# Patient Record
Sex: Male | Born: 1965 | Race: White | Hispanic: No | Marital: Single | Smoking: Current every day smoker
Health system: Southern US, Community
[De-identification: ages and names within clinical notes are randomized; demographics above are authoritative.]

---

## 2011-11-22 ENCOUNTER — Emergency Department: Payer: Self-pay | Admitting: Unknown Physician Specialty

## 2013-06-15 ENCOUNTER — Ambulatory Visit: Admit: 2013-06-15 | Disposition: A | Discharge status: Home / Self Care | Payer: Self-pay | Admitting: Family Medicine

## 2020-10-08 DIAGNOSIS — M7989 Other specified soft tissue disorders: Secondary | ICD-10-CM | POA: Diagnosis present

## 2020-10-21 DIAGNOSIS — F1410 Cocaine abuse, uncomplicated: Secondary | ICD-10-CM | POA: Diagnosis present

## 2020-10-21 DIAGNOSIS — F329 Major depressive disorder, single episode, unspecified: Secondary | ICD-10-CM | POA: Diagnosis present

## 2020-10-21 DIAGNOSIS — F32A Depression, unspecified: Secondary | ICD-10-CM | POA: Diagnosis present

## 2020-11-08 DIAGNOSIS — F17200 Nicotine dependence, unspecified, uncomplicated: Secondary | ICD-10-CM | POA: Diagnosis present

## 2020-12-09 DIAGNOSIS — E109 Type 1 diabetes mellitus without complications: Secondary | ICD-10-CM | POA: Diagnosis present

## 2021-02-05 ENCOUNTER — Other Ambulatory Visit: Payer: Self-pay

## 2021-02-05 ENCOUNTER — Emergency Department: Payer: PRIVATE HEALTH INSURANCE

## 2021-02-05 ENCOUNTER — Encounter: Payer: Self-pay | Admitting: Radiology

## 2021-02-05 ENCOUNTER — Inpatient Hospital Stay
Admission: EM | Admit: 2021-02-05 | Discharge: 2021-02-09 | DRG: 565 | Disposition: A | Discharge status: Home Health | Payer: PRIVATE HEALTH INSURANCE | Attending: Student | Admitting: Student

## 2021-02-05 DIAGNOSIS — F329 Major depressive disorder, single episode, unspecified: Secondary | ICD-10-CM | POA: Diagnosis present

## 2021-02-05 DIAGNOSIS — L89892 Pressure ulcer of other site, stage 2: Secondary | ICD-10-CM | POA: Diagnosis present

## 2021-02-05 DIAGNOSIS — F1423 Cocaine dependence with withdrawal: Secondary | ICD-10-CM | POA: Diagnosis present

## 2021-02-05 DIAGNOSIS — Z9114 Patient's other noncompliance with medication regimen: Secondary | ICD-10-CM

## 2021-02-05 DIAGNOSIS — L03115 Cellulitis of right lower limb: Secondary | ICD-10-CM | POA: Diagnosis present

## 2021-02-05 DIAGNOSIS — E785 Hyperlipidemia, unspecified: Secondary | ICD-10-CM | POA: Diagnosis present

## 2021-02-05 DIAGNOSIS — F1210 Cannabis abuse, uncomplicated: Secondary | ICD-10-CM | POA: Diagnosis present

## 2021-02-05 DIAGNOSIS — L8990 Pressure ulcer of unspecified site, unspecified stage: Secondary | ICD-10-CM | POA: Insufficient documentation

## 2021-02-05 DIAGNOSIS — Z720 Tobacco use: Secondary | ICD-10-CM | POA: Diagnosis not present

## 2021-02-05 DIAGNOSIS — T8743 Infection of amputation stump, right lower extremity: Secondary | ICD-10-CM | POA: Diagnosis present

## 2021-02-05 DIAGNOSIS — Z79899 Other long term (current) drug therapy: Secondary | ICD-10-CM

## 2021-02-05 DIAGNOSIS — W19XXXA Unspecified fall, initial encounter: Secondary | ICD-10-CM | POA: Diagnosis present

## 2021-02-05 DIAGNOSIS — E109 Type 1 diabetes mellitus without complications: Secondary | ICD-10-CM | POA: Diagnosis present

## 2021-02-05 DIAGNOSIS — Y835 Amputation of limb(s) as the cause of abnormal reaction of the patient, or of later complication, without mention of misadventure at the time of the procedure: Secondary | ICD-10-CM | POA: Diagnosis present

## 2021-02-05 DIAGNOSIS — F32A Depression, unspecified: Secondary | ICD-10-CM | POA: Diagnosis present

## 2021-02-05 DIAGNOSIS — Z20822 Contact with and (suspected) exposure to covid-19: Secondary | ICD-10-CM | POA: Diagnosis present

## 2021-02-05 DIAGNOSIS — Z760 Encounter for issue of repeat prescription: Secondary | ICD-10-CM

## 2021-02-05 DIAGNOSIS — Z794 Long term (current) use of insulin: Secondary | ICD-10-CM | POA: Diagnosis not present

## 2021-02-05 DIAGNOSIS — F1493 Cocaine use, unspecified with withdrawal: Secondary | ICD-10-CM

## 2021-02-05 DIAGNOSIS — F1410 Cocaine abuse, uncomplicated: Secondary | ICD-10-CM | POA: Diagnosis present

## 2021-02-05 DIAGNOSIS — F419 Anxiety disorder, unspecified: Secondary | ICD-10-CM | POA: Diagnosis present

## 2021-02-05 DIAGNOSIS — R45851 Suicidal ideations: Secondary | ICD-10-CM | POA: Diagnosis present

## 2021-02-05 DIAGNOSIS — E1040 Type 1 diabetes mellitus with diabetic neuropathy, unspecified: Secondary | ICD-10-CM | POA: Diagnosis present

## 2021-02-05 DIAGNOSIS — M7989 Other specified soft tissue disorders: Secondary | ICD-10-CM | POA: Diagnosis present

## 2021-02-05 DIAGNOSIS — Z89611 Acquired absence of right leg above knee: Secondary | ICD-10-CM

## 2021-02-05 DIAGNOSIS — L089 Local infection of the skin and subcutaneous tissue, unspecified: Secondary | ICD-10-CM

## 2021-02-05 DIAGNOSIS — F17200 Nicotine dependence, unspecified, uncomplicated: Secondary | ICD-10-CM | POA: Diagnosis present

## 2021-02-05 DIAGNOSIS — Z9119 Patient's noncompliance with other medical treatment and regimen: Secondary | ICD-10-CM

## 2021-02-05 DIAGNOSIS — T148XXA Other injury of unspecified body region, initial encounter: Secondary | ICD-10-CM

## 2021-02-05 LAB — CBC
HCT: 39.3 % (ref 39.0–52.0)
Hemoglobin: 13.6 g/dL (ref 13.0–17.0)
MCH: 30.4 pg (ref 26.0–34.0)
MCHC: 34.6 g/dL (ref 30.0–36.0)
MCV: 87.7 fL (ref 80.0–100.0)
Platelets: 241 10*3/uL (ref 150–400)
RBC: 4.48 MIL/uL (ref 4.22–5.81)
RDW: 12.8 % (ref 11.5–15.5)
WBC: 12.5 10*3/uL — ABNORMAL HIGH (ref 4.0–10.5)
nRBC: 0 % (ref 0.0–0.2)

## 2021-02-05 LAB — URINE DRUG SCREEN, QUALITATIVE (ARMC ONLY)
Amphetamines, Ur Screen: NOT DETECTED
Barbiturates, Ur Screen: NOT DETECTED
Benzodiazepine, Ur Scrn: NOT DETECTED
Cannabinoid 50 Ng, Ur ~~LOC~~: POSITIVE — AB
Cocaine Metabolite,Ur ~~LOC~~: POSITIVE — AB
MDMA (Ecstasy)Ur Screen: NOT DETECTED
Methadone Scn, Ur: NOT DETECTED
Opiate, Ur Screen: NOT DETECTED
Phencyclidine (PCP) Ur S: NOT DETECTED
Tricyclic, Ur Screen: NOT DETECTED

## 2021-02-05 LAB — RESP PANEL BY RT-PCR (FLU A&B, COVID) ARPGX2
Influenza A by PCR: NEGATIVE
Influenza B by PCR: NEGATIVE
SARS Coronavirus 2 by RT PCR: NEGATIVE

## 2021-02-05 LAB — BASIC METABOLIC PANEL
Anion gap: 12 (ref 5–15)
BUN: 20 mg/dL (ref 6–20)
CO2: 21 mmol/L — ABNORMAL LOW (ref 22–32)
Calcium: 9.2 mg/dL (ref 8.9–10.3)
Chloride: 101 mmol/L (ref 98–111)
Creatinine, Ser: 0.76 mg/dL (ref 0.61–1.24)
GFR, Estimated: >60 mL/min (ref >60)
Glucose, Bld: 104 mg/dL — ABNORMAL HIGH (ref 70–99)
Potassium: 3.7 mmol/L (ref 3.5–5.1)
Sodium: 134 mmol/L — ABNORMAL LOW (ref 135–145)

## 2021-02-05 LAB — ACETAMINOPHEN LEVEL: Acetaminophen (Tylenol), Serum: <10 ug/mL — ABNORMAL LOW (ref 10–30)

## 2021-02-05 LAB — CBG MONITORING, ED: Glucose-Capillary: 271 mg/dL — ABNORMAL HIGH (ref 70–99)

## 2021-02-05 LAB — SALICYLATE LEVEL: Salicylate Lvl: <7 mg/dL — ABNORMAL LOW (ref 7.0–30.0)

## 2021-02-05 LAB — ETHANOL: Alcohol, Ethyl (B): <10 mg/dL (ref <10)

## 2021-02-05 MED ORDER — GABAPENTIN 400 MG PO CAPS
400.0000 mg | ORAL_CAPSULE | Freq: Three times a day (TID) | ORAL | Status: DC
  Administered 2021-02-06 – 2021-02-09 (×11): 400 mg via ORAL
  Filled 2021-02-05 (×12): qty 1

## 2021-02-05 MED ORDER — INSULIN DETEMIR 100 UNIT/ML ~~LOC~~ SOLN
14.0000 [IU] | Freq: Two times a day (BID) | SUBCUTANEOUS | Status: DC
  Filled 2021-02-05 (×2): qty 0.14

## 2021-02-05 MED ORDER — VANCOMYCIN HCL 1500 MG/300ML IV SOLN
1500.0000 mg | Freq: Once | INTRAVENOUS | Status: AC
  Administered 2021-02-05: 1500 mg via INTRAVENOUS
  Filled 2021-02-05: qty 300

## 2021-02-05 MED ORDER — MORPHINE SULFATE (PF) 2 MG/ML IV SOLN
2.0000 mg | INTRAVENOUS | Status: DC | PRN
  Administered 2021-02-05 – 2021-02-07 (×3): 2 mg via INTRAVENOUS
  Filled 2021-02-05 (×3): qty 1

## 2021-02-05 MED ORDER — INSULIN DETEMIR 100 UNIT/ML ~~LOC~~ SOLN
14.0000 [IU] | Freq: Two times a day (BID) | SUBCUTANEOUS | Status: DC
  Filled 2021-02-05: qty 0.14

## 2021-02-05 MED ORDER — ONDANSETRON HCL 4 MG PO TABS: 4.0000 mg | ORAL_TABLET | Freq: Four times a day (QID) | ORAL | Status: DC | PRN

## 2021-02-05 MED ORDER — SODIUM CHLORIDE 0.9 % IV SOLN: INTRAVENOUS | Status: DC

## 2021-02-05 MED ORDER — DOXYCYCLINE HYCLATE 100 MG PO CAPS
100.0000 mg | ORAL_CAPSULE | Freq: Two times a day (BID) | ORAL | 0 refills | Status: DC
  Filled 2021-02-05: qty 10, 5d supply, fill #0

## 2021-02-05 MED ORDER — LISINOPRIL 2.5 MG PO TABS
2.5000 mg | ORAL_TABLET | Freq: Every day | ORAL | Status: DC
  Administered 2021-02-06 – 2021-02-09 (×4): 2.5 mg via ORAL
  Filled 2021-02-05 (×4): qty 1

## 2021-02-05 MED ORDER — ATORVASTATIN CALCIUM 20 MG PO TABS
20.0000 mg | ORAL_TABLET | Freq: Every day | ORAL | Status: DC
  Administered 2021-02-06 – 2021-02-09 (×4): 20 mg via ORAL
  Filled 2021-02-05 (×4): qty 1

## 2021-02-05 MED ORDER — INSULIN ASPART 100 UNIT/ML IJ SOLN
0.0000 [IU] | Freq: Every day | INTRAMUSCULAR | Status: DC
  Administered 2021-02-05: 3 [IU] via SUBCUTANEOUS
  Administered 2021-02-06: 4 [IU] via SUBCUTANEOUS
  Filled 2021-02-05 (×2): qty 1

## 2021-02-05 MED ORDER — SODIUM CHLORIDE 0.9 % IV SOLN: 1.0000 g | Freq: Once | INTRAVENOUS | Status: DC

## 2021-02-05 MED ORDER — INSULIN DETEMIR 100 UNIT/ML ~~LOC~~ SOLN
18.0000 [IU] | Freq: Two times a day (BID) | SUBCUTANEOUS | Status: DC
  Administered 2021-02-06 – 2021-02-09 (×7): 18 [IU] via SUBCUTANEOUS
  Filled 2021-02-05 (×9): qty 0.18

## 2021-02-05 MED ORDER — VANCOMYCIN HCL IN DEXTROSE 1-5 GM/200ML-% IV SOLN
1000.0000 mg | Freq: Two times a day (BID) | INTRAVENOUS | Status: DC
  Administered 2021-02-06 – 2021-02-09 (×7): 1000 mg via INTRAVENOUS
  Filled 2021-02-05 (×11): qty 200

## 2021-02-05 MED ORDER — INSULIN ASPART 100 UNIT/ML IJ SOLN
0.0000 [IU] | Freq: Three times a day (TID) | INTRAMUSCULAR | Status: DC
  Administered 2021-02-06 (×2): 4 [IU] via SUBCUTANEOUS
  Administered 2021-02-06: 11 [IU] via SUBCUTANEOUS
  Administered 2021-02-07: 3 [IU] via SUBCUTANEOUS
  Administered 2021-02-07: 7 [IU] via SUBCUTANEOUS
  Filled 2021-02-05 (×5): qty 1

## 2021-02-05 MED ORDER — PIPERACILLIN-TAZOBACTAM 3.375 G IVPB 30 MIN
3.3750 g | Freq: Once | INTRAVENOUS | Status: AC
  Administered 2021-02-05: 3.375 g via INTRAVENOUS
  Filled 2021-02-05: qty 50

## 2021-02-05 MED ORDER — ONDANSETRON HCL 4 MG/2ML IJ SOLN: 4.0000 mg | Freq: Four times a day (QID) | INTRAMUSCULAR | Status: DC | PRN

## 2021-02-05 MED ORDER — ENOXAPARIN SODIUM 40 MG/0.4ML IJ SOSY
40.0000 mg | PREFILLED_SYRINGE | INTRAMUSCULAR | Status: DC
  Administered 2021-02-05 – 2021-02-09 (×4): 40 mg via SUBCUTANEOUS
  Filled 2021-02-05 (×4): qty 0.4

## 2021-02-05 MED ORDER — SODIUM CHLORIDE 0.9 % IV SOLN
2.0000 g | Freq: Three times a day (TID) | INTRAVENOUS | Status: DC
  Administered 2021-02-06 – 2021-02-09 (×11): 2 g via INTRAVENOUS
  Filled 2021-02-05 (×14): qty 2

## 2021-02-05 NOTE — ED Provider Notes (Addendum)
Patient was reporting some neck pain and does report having a fall.  Will get CT head and CT cervical.  He states that he fell on his butt mostly but given his substances I think it be safest to get a CT image.  Patient is denying SI but his mom called stating that she was concerned that he was having SI.  Therefore psych consult was placed.  Patient is also interested in detox therefore TTS was placed.  Pt states he does not know how to get into his home. Unclear if pt took insulin today and sugar here is normal. D/w pharmacy and pts on NPH. Ordered to start tomorrow given low sugars today and unclear if already took it.   The patient has been placed in psychiatric observation due to the need to provide a safe environment for the patient while obtaining psychiatric consultation and evaluation, as well as ongoing medical and medication management to treat the patient's condition.  The patient has not been placed under full IVC at this time.  Pt now more awake and reporting R leg redness/drainage. Pt has history of nec fasc is this leg- does not look like nec fasc now. Pt well appearing. Will get xray to confirm no free air. Pt not septic but took wound vac off most likely leading to the cellulitis. Will d/w medicine for admission to help with wound care/ IV antibiotics.         Concha Se, MD 02/05/21 2204

## 2021-02-05 NOTE — ED Provider Notes (Signed)
[facility] Emergency Department Provider Note   ____________________________________________   Event Date/Time   First MD Initiated Contact with Patient 02/05/21 1532     (approximate)  I have reviewed the triage vital signs and the nursing notes.   HISTORY  Chief Complaint Drug Problem and Medication Refill    HPI Samuel Walton is a 55 y.o. male who presents requesting medication refills as well as wound care to his AKA on the right.  Patient states that he has had a problem with cocaine in the past and within the last 24 hours went on a "crack bender".  Patient states that he did have some mild trauma to this right AKA after a fall last night resulting in significant pain today as well as purulent drainage.  Patient is requesting a prescription for his currently prescribed subcu insulin as well as resources for cocaine rehabilitation. Patient currently denies any vision changes, tinnitus, difficulty speaking, facial droop, sore throat, chest pain, shortness of breath, abdominal pain, nausea/vomiting/diarrhea, dysuria, or weakness/numbness/paresthesias in any extremity          No past medical history on file.  There are no problems to display for this patient.    Prior to Admission medications   Medication Sig Start Date End Date Taking? Authorizing Provider  doxycycline (VIBRAMYCIN) 50 MG capsule Take 2 capsules (100 mg total) by mouth 2 (two) times daily for 5 days. 02/05/21 02/10/21 Yes Merwyn Katos, MD    Allergies Patient has no known allergies.  No family history on file.  Social History    Review of Systems Constitutional: No fever/chills Eyes: No visual changes. ENT: No sore throat. Cardiovascular: Denies chest pain. Respiratory: Denies shortness of breath. Gastrointestinal: No abdominal pain.  No nausea, no vomiting.  No diarrhea. Genitourinary: Negative for dysuria. Musculoskeletal: For acute right stump pain with  surrounding erythema Skin: Negative for rash. Neurological: Negative for headaches, weakness/numbness/paresthesias in any extremity Psychiatric: Negative for suicidal ideation/homicidal ideation   ____________________________________________   PHYSICAL EXAM:  VITAL SIGNS: ED Triage Vitals  Enc Vitals Group     BP 02/05/21 1458 139/90     Pulse Rate 02/05/21 1456 (!) 104     Resp 02/05/21 1456 18     Temp 02/05/21 1456 98.3 F (36.8 C)     Temp Source 02/05/21 1456 Oral     SpO2 02/05/21 1456 98 %     Weight 02/05/21 1457 140 lb (63.5 kg)     Height 02/05/21 1457 5\' 7"  (1.702 m)     Head Circumference --      Peak Flow --      Pain Score 02/05/21 1457 5     Pain Loc --      Pain Edu? --      Excl. in GC? --    Constitutional: Alert and oriented. Well appearing and in no acute distress. Eyes: Conjunctivae are normal. PERRL. Head: Atraumatic. Nose: No congestion/rhinnorhea. Mouth/Throat: Mucous membranes are moist. Neck: No stridor Cardiovascular: Grossly normal heart sounds.  Good peripheral circulation. Respiratory: Normal respiratory effort.  No retractions. Gastrointestinal: Soft and nontender. No distention. Musculoskeletal: Right AKA at the upper thigh Neurologic:  Normal speech and language. No gross focal neurologic deficits are appreciated. Skin:  Skin is warm and dry.  Ulceration to the middle aspect of the right AKA as well as some surrounding erythema and induration Psychiatric: Mood and affect are normal. Speech and behavior are normal.  ____________________________________________   LABS (all  labs ordered are listed, but only abnormal results are displayed)  Labs Reviewed  CBC - Abnormal; Notable for the following components:      Result Value   WBC 12.5 (*)    All other components within normal limits  BASIC METABOLIC PANEL - Abnormal; Notable for the following components:   Sodium 134 (*)    CO2 21 (*)    Glucose, Bld 104 (*)    All other  components within normal limits   PROCEDURES  Procedure(s) performed (including Critical Care):  Procedures   ____________________________________________   INITIAL IMPRESSION / ASSESSMENT AND PLAN / ED COURSE  As part of my medical decision making, I reviewed the following data within the electronic medical record, if available:  Nursing notes reviewed and incorporated, Labs reviewed, EKG interpreted, Old chart reviewed, Radiograph reviewed and Notes from prior ED visits reviewed and incorporated      Presentation most consistent with simple cellulitis. Given History, Exam, and Workup I have low suspicion for Necrotizing Fasciitis, Abscess, Osteomyelitis, DVT or other emergent problem as a cause for this presentation.  Rx: Doxycycline 100 mg twice daily x5 days  Disposition: Discharge. No evidence of serious bacterial illness. Nontoxic appearing, VSS. Low risk for treatment failure based on history. Strict return precautions discussed with patient with full understanding. Advised patient to follow up promptly with primary care provider within next 48 hours.      ____________________________________________   FINAL CLINICAL IMPRESSION(S) / ED DIAGNOSES  Final diagnoses:  Medication refill  Cocaine withdrawal (HCC)  Hx of AKA (above knee amputation), right (HCC)  Cellulitis of right lower extremity     ED Discharge Orders          Ordered    doxycycline (VIBRAMYCIN) 50 MG capsule  2 times daily        02/05/21 1847             Note:  This document was prepared using Dragon voice recognition software and may include unintentional dictation errors.    Merwyn Katos, MD 02/05/21 [phone]

## 2021-02-05 NOTE — H&P (Signed)
History and Physical   Samuel Walton GLO:[id number] DOB: April 27, 1966 DOA: 02/05/2021  Referring MD/NP/PA: Dr. Fuller Plan  PCP: Pcp, No   Outpatient Specialists: [facility]  Patient coming from: Home  Chief Complaint: Fall with wound drainage  HPI: Samuel Walton is a 55 y.o. 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  Patient has 1 day he says of his stump on the right side.  He apparently has a wound VAC but has been staying away from his home over in [place] with a girlfriend.  Apparently his Leviste duration was not ideal and hemoglobin able to use his wound VAC.  Has not been able to take care of himself.  There is report of a lot of drugs involved as patient has also past history of cocaine abuse and the environment was not conducive for his care.  He has therefore not getting wound care and other medications.  Came to the ER was copious discharge from the stump and cellulitis surrounding the area.  While in the ER patient also voiced suicidal ideation but no plans.  He appears to be frustrated about his living condition.  Psych consult was called for and patient now denies suicidal ideation.  He has also voiced desire to have detoxification from drugs including cocaine.  He apparently had necrotizing fasciitis in the leg which led to initial surgery.  He is therefore being admitted for IV antibiotics as well as other supportive care..  ED Course: Temperature is 98.3 blood pressure 134/70, pulse 104, respirate of 18 oxygen sat 98% room air.  White count 12.5 hemoglobin 13.6 and platelets 241.  Sodium 134 potassium 3.7 chloride 101 CO2 21 BUN 20 creatinine 1.76.  Glucose is 271.  Urine drug screen is positive for cocaine and cannabis.  CT head and cervical spine  all within normal.  X-ray of the right femur area shows status post right leg amputation.  No soft tissue gas.  Patient will therefore be admitted for wound care as well as IV antibiotics.  Review of Systems: As per HPI otherwise 10 point review of systems negative.    History reviewed. No pertinent past medical history.  History reviewed. No pertinent surgical history.   has no history on file for tobacco use, alcohol use, and drug use.  No Known Allergies  History reviewed. No pertinent family history.   Prior to Admission medications   Medication Sig Start Date End Date Taking? Authorizing Provider  atorvastatin (LIPITOR) 20 MG tablet Take 1 tablet by mouth daily. 01/10/21  Yes [provider]  doxycycline (VIBRAMYCIN) 50 MG capsule Take 2 capsules (100 mg total) by mouth 2 (two) times daily for 5 days. 02/05/21 02/10/21 Yes Merwyn Katos, MD  gabapentin (NEURONTIN) 400 MG capsule Take 400 mg by mouth 3 (three) times daily. 01/11/21 02/10/21 Yes [provider]  insulin NPH Human (NOVOLIN N) 100 UNIT/ML injection Inject 18 Units into the skin 2 (two) times daily.   Yes [provider]  lisinopril (ZESTRIL) 5 MG tablet Take 2.5 mg by mouth daily. 12/22/20  Yes [provider]    Physical Exam: Vitals:   02/05/21 1456 02/05/21 1457 02/05/21 1458 02/05/21 1757  BP:   139/90 135/70  Pulse: (!) 104   100  Resp: 18   17  Temp:  98.3 F (36.8 C)     TempSrc: Oral     SpO2: 98%   98%  Weight:  63.5 kg    Height:  5\' 7"  (1.702 m)        Constitutional: Acutely ill looking, weak, depressed, withdrawn Vitals:   02/05/21 1456 02/05/21 1457 02/05/21 1458 02/05/21 1757  BP:   139/90 135/70  Pulse: (!) 104   100  Resp: 18   17  Temp: 98.3 F (36.8 C)     TempSrc: Oral     SpO2: 98%   98%  Weight:  63.5 kg    Height:  5\' 7"  (1.702 m)     Eyes: PERRL, lids and conjunctivae normal ENMT: Mucous membranes are dry. Posterior pharynx clear of any exudate  or lesions.Normal dentition.  Neck: normal, supple, no masses, no thyromegaly Respiratory: clear to auscultation bilaterally, no wheezing, no crackles. Normal respiratory effort. No accessory muscle use.  Cardiovascular: Sinus tachycardia, no murmurs / rubs / gallops. No extremity edema. 2+ pedal pulses. No carotid bruits.  Abdomen: no tenderness, no masses palpated. No hepatosplenomegaly. Bowel sounds positive.  Musculoskeletal: no clubbing / cyanosis.  Status post right leg amputation with visible stump nonhealed. Normal muscle tone.  Skin: Significant cellulitis around his right leg stump Neurologic: CN 2-12 grossly intact. Sensation intact, DTR normal. Strength 5/5 in all 4.  Psychiatric: Normal judgment and insight. Alert and oriented x 3. Normal mood.     Labs on Admission: I have personally reviewed following labs and imaging studies  CBC: Recent Labs  Lab 02/05/21 1502  WBC 12.5*  HGB 13.6  HCT 39.3  MCV 87.7  PLT 241   Basic Metabolic Panel: Recent Labs  Lab 02/05/21 1502  NA 134*  K 3.7  CL 101  CO2 21*  GLUCOSE 104*  BUN 20  CREATININE 0.76  CALCIUM 9.2   GFR: Estimated Creatinine Clearance: 93.7 mL/min (by C-G formula based on SCr of 0.76 mg/dL). Liver Function Tests: No results for input(s): AST, ALT, ALKPHOS, BILITOT, PROT, ALBUMIN in the last 168 hours. No results for input(s): LIPASE, AMYLASE in the last 168 hours. No results for input(s): AMMONIA in the last 168 hours. Coagulation Profile: No results for input(s): INR, PROTIME in the last 168 hours. Cardiac Enzymes: No results for input(s): CKTOTAL, CKMB, CKMBINDEX, TROPONINI in the last 168 hours. BNP (last 3 results) No results for input(s): PROBNP in the last 8760 hours. HbA1C: No results for input(s): HGBA1C in the last 72 hours. CBG: Recent Labs  Lab 02/05/21 2343  GLUCAP 271*   Lipid Profile: No results for input(s): CHOL, HDL, LDLCALC, TRIG, CHOLHDL, LDLDIRECT in the last 72  hours. Thyroid Function Tests: No results for input(s): TSH, T4TOTAL, FREET4, T3FREE, THYROIDAB in the last 72 hours. Anemia Panel: No results for input(s): VITAMINB12, FOLATE, FERRITIN, TIBC, IRON, RETICCTPCT in the last 72 hours. Urine analysis: No results found for: COLORURINE, APPEARANCEUR, LABSPEC, PHURINE, GLUCOSEU, HGBUR, BILIRUBINUR, KETONESUR, PROTEINUR, UROBILINOGEN, NITRITE, LEUKOCYTESUR Sepsis Labs: @LABRCNTIP (procalcitonin:4,lacticidven:4) ) Recent Results (from the past 240 hour(s))  Resp Panel by RT-PCR (Flu A&B, Covid) Nasopharyngeal Swab     Status: None   Collection Time: 02/05/21  8:29 PM   Specimen: Nasopharyngeal Swab; Nasopharyngeal(NP) swabs in vial transport medium  Result Value Ref Range Status   SARS Coronavirus 2 by RT PCR NEGATIVE NEGATIVE Final    Comment: (NOTE) SARS-CoV-2 target nucleic acids are NOT DETECTED.  The SARS-CoV-2 RNA is generally detectable in upper respiratory specimens during the  acute phase of infection. The lowest concentration of SARS-CoV-2 viral copies this assay can detect is 138 copies/mL. A negative result does not preclude SARS-Cov-2 infection and should not be used as the sole basis for treatment or other patient management decisions. A negative result may occur with  improper specimen collection/handling, submission of specimen other than nasopharyngeal swab, presence of viral mutation(s) within the areas targeted by this assay, and inadequate number of viral copies(<138 copies/mL). A negative result must be combined with clinical observations, patient history, and epidemiological information. The expected result is Negative.  Fact Sheet for Patients:  BloggerCourse.comhttps://www.fda.gov/media/152166/download  Fact Sheet for Healthcare Providers:  SeriousBroker.ithttps://www.fda.gov/media/152162/download  This test is no t yet approved or cleared by the [place] FDA and  has been authorized for detection and/or diagnosis of SARS-CoV-2 by FDA under  an Emergency Use Authorization (EUA). This EUA will remain  in effect (meaning this test can be used) for the duration of the COVID-19 declaration under Section 564(b)(1) of the Act, 21 U.S.C.section 360bbb-3(b)(1), unless the authorization is terminated  or revoked sooner.       Influenza A by PCR NEGATIVE NEGATIVE Final   Influenza B by PCR NEGATIVE NEGATIVE Final    Comment: (NOTE) The Xpert Xpress SARS-CoV-2/FLU/RSV plus assay is intended as an aid in the diagnosis of influenza from Nasopharyngeal swab specimens and should not be used as a sole basis for treatment. Nasal washings and aspirates are unacceptable for Xpert Xpress SARS-CoV-2/FLU/RSV testing.  Fact Sheet for Patients: BloggerCourse.comhttps://www.fda.gov/media/152166/download  Fact Sheet for Healthcare Providers: SeriousBroker.ithttps://www.fda.gov/media/152162/download  This test is not yet approved or cleared by the [place] FDA and has been authorized for detection and/or diagnosis of SARS-CoV-2 by FDA under an Emergency Use Authorization (EUA). This EUA will remain in effect (meaning this test can be used) for the duration of the COVID-19 declaration under Section 564(b)(1) of the Act, 21 U.S.C. section 360bbb-3(b)(1), unless the authorization is terminated or revoked.  Performed at [facility], [address] [place], [place] [address]      Radiological Exams on Admission: CT HEAD WO CONTRAST (5MM)  Result Date: 02/05/2021 CLINICAL DATA:  Status post trauma. EXAM: CT HEAD WITHOUT CONTRAST TECHNIQUE: Contiguous axial images were obtained from the base of the skull through the vertex without intravenous contrast. COMPARISON:  None. FINDINGS: Brain: No evidence of acute infarction, hemorrhage, hydrocephalus, extra-axial collection or mass lesion/mass effect. Vascular: No hyperdense vessel or unexpected calcification. Skull: Normal. Negative for fracture or focal lesion. Sinuses/Orbits: No acute finding. Other: Very mild left  parieto-occipital scalp soft tissue swelling is seen. IMPRESSION: No acute intracranial pathology. Electronically Signed   By: Aram Candelahaddeus  Houston M.D.   On: 02/05/2021 21:30   CT Cervical Spine Wo Contrast  Result Date: 02/05/2021 CLINICAL DATA:  Status post fall. EXAM: CT CERVICAL SPINE WITHOUT CONTRAST TECHNIQUE: Multidetector CT imaging of the cervical spine was performed without intravenous contrast. Multiplanar CT image reconstructions were also generated. COMPARISON:  None. FINDINGS: Alignment: Normal. Skull base and vertebrae: No acute fracture. No primary bone lesion or focal pathologic process. Soft tissues and spinal canal: No prevertebral fluid or swelling. No visible canal hematoma. Disc levels: Mild anterior osteophyte formation is seen at the levels of C4-C5 and C5-C6. Normal multilevel intervertebral disc spaces are present. Normal bilateral multilevel facet joints are noted. Upper chest: Negative. Other: None. IMPRESSION: 1. No acute fracture or subluxation of the cervical spine. 2. Mild degenerative changes at the levels of C4-C5 and C5-C6. Electronically Signed  By: Aram Candela M.D.   On: 02/05/2021 21:33   DG Femur Min 2 Views Right  Result Date: 02/05/2021 CLINICAL DATA:  Right leg/thigh amputation, cellulitis, evaluate for soft tissue gas EXAM: RIGHT FEMUR 2 VIEWS COMPARISON:  None. FINDINGS: Status post right leg amputation. Mild soft tissue swelling is possible in this patient with reported history of cellulitis. However, there is no radiographic findings to suggest soft tissue gas in this location. Mild degenerative changes of the lower lumbar spine. IMPRESSION: Status post right leg amputation. No radiographic findings to suggest soft tissue gas. Electronically Signed   By: Charline Bills M.D.   On: 02/05/2021 22:40      Assessment/Plan Principal Problem:   Infected wound Active Problems:   Cocaine use disorder (HCC)   Necrotizing soft tissue infection   Tobacco  use disorder   Type 1 diabetes (HCC)   Depressive disorder     #1 infected wound: Patient has infected stump.  Most likely due to not complying with medications and treatment.  At this point we will admit the patient.  IV vancomycin and cefepime.  Blood cultures and wound culture obtained.  Wound care consult.  #2 diabetes: Initiate sliding scale insulin.  Continue long-acting insulin from home  #3 cocaine abuse: Counseling provided.  Patient is interested in detox.  We will have social work consult.  #4 tobacco and marijuana abuse: Counseling.  Continue with plan for detoxification and outpatient support services  #5 depression with possible suicidal ideation: At this point patient denied suicidal ideation.  We will continue supportive care.  Transfer to voluntary hold in the ER for safety.   DVT prophylaxis: Lovenox Code Status: Full code Family Communication: Mother over the phone Disposition Plan: Home Consults called: None Admission status: Inpatient  Severity of Illness: The appropriate patient status for this patient is INPATIENT. Inpatient status is judged to be reasonable and necessary in order to provide the required intensity of service to ensure the patient's safety. The patient's presenting symptoms, physical exam findings, and initial radiographic and laboratory data in the context of their chronic comorbidities is felt to place them at high risk for further clinical deterioration. Furthermore, it is not anticipated that the patient will be medically stable for discharge from the hospital within 2 midnights of admission. The following factors support the patient status of inpatient.   " The patient's presenting symptoms include right stump infection. " The worrisome physical exam findings include drainage and cellulitis of the right stump. " The initial radiographic and laboratory data are worrisome because of evidence of cocaine and other drug abuse. " The chronic  co-morbidities include necrotizing fasciitis.   * I certify that at the point of admission it is my clinical judgment that the patient will require inpatient hospital care spanning beyond 2 midnights from the point of admission due to high intensity of service, high risk for further deterioration and high frequency of surveillance required.Lonia Blood MD [facility] Pager [phone]  If 7PM-7AM, please contact night-coverage [email or web address] Password [email or web address]  02/06/2021, 12:41 AM

## 2021-02-05 NOTE — ED Triage Notes (Signed)
Pt here for medication on his fast acting insulin and wants info on rehab for drugs. Pt admits to using crack and would like to stop. Pt has his right leg and thigh amputated and is carrying a large bag with his wound vac in it. Pt stable on arrival to ED.

## 2021-02-05 NOTE — ED Notes (Signed)
Pt has a right leg amputation , trying to locate wound vac supplies

## 2021-02-05 NOTE — BH Assessment (Signed)
This Clinical research associate provided patient with inpatient detox facilities and outpatient substance abuse facilities as resources for the patient at his request. Patient reports that he does not want to get help for his substance use and reports "I'm only doing it because of my mom, she wants me to stop." Patient also denies any current SI. Patient is receptive of the resources  TTS Consult Completed

## 2021-02-05 NOTE — Progress Notes (Signed)
Pharmacy Antibiotic Note  Samuel Walton is a 55 y.o. male admitted on 02/05/2021 with  wound infection .  Pharmacy has been consulted for Cefepime, Vancomycin  dosing.  Plan: Zosyn 3.375 gm IV X 1 over 30 min given on 8/29 @ 2233. Cefepime 2 gm IV Q8H ordered to start on 8/30 @ 0400.  Vancomycin 1500 mg IV X 1 given on 8/29 @ 2248. Vancomycin 1 gm IV Q12H ordered to start on 8/30 @ 1100.  AUC = 512.5 Vanc trough = 13.3   Height: 5\' 7"  (170.2 cm) Weight: 63.5 kg (140 lb) IBW/kg (Calculated) : 66.1  Temp (24hrs), Avg:98.3 F (36.8 C), Min:98.3 F (36.8 C), Max:98.3 F (36.8 C)  Recent Labs  Lab 02/05/21 1502  WBC 12.5*  CREATININE 0.76    Estimated Creatinine Clearance: 93.7 mL/min (by C-G formula based on SCr of 0.76 mg/dL).    No Known Allergies  Antimicrobials this admission:   >>    >>   Dose adjustments this admission:   Microbiology results:  BCx:   UCx:    Sputum:    MRSA PCR:   Thank you for allowing pharmacy to be a part of this patient's care.  Amrie Gurganus D 02/05/2021 11:15 PM

## 2021-02-05 NOTE — Progress Notes (Signed)
PHARMACY -  BRIEF ANTIBIOTIC NOTE   Pharmacy has received consult(s) for vancomycin from an ED provider.  The patient's profile has been reviewed for ht/wt/allergies/indication/available labs.    One time order(s) placed for vancomycin 1500 mg IV  Further antibiotics/pharmacy consults should be ordered by admitting physician if indicated.                       Thank you, Robyne Peers Chenoa Luddy 02/05/2021  10:11 PM

## 2021-02-05 NOTE — ED Notes (Signed)
Pt bedding changed. Urine soaked sheets. Pt changed into blue paper scrub pants and a burgundy psych scrub shirt. Brief also in place with chuck underneath pt.  Pt continued to complain of upper neck pain. Pt states that he fell onto concrete flooring and his neck is really bothering him. Pt said that he has not eaten today. Tech provided pt with drink and sandwich tray.  Pt said that he is continent with his urine out put and will provide a sample when he drinks a bit.

## 2021-02-06 ENCOUNTER — Other Ambulatory Visit: Payer: Self-pay

## 2021-02-06 ENCOUNTER — Encounter: Payer: Self-pay | Admitting: Internal Medicine

## 2021-02-06 LAB — COMPREHENSIVE METABOLIC PANEL
ALT: 20 U/L (ref 0–44)
AST: 21 U/L (ref 15–41)
Albumin: 3.5 g/dL (ref 3.5–5.0)
Alkaline Phosphatase: 64 U/L (ref 38–126)
Anion gap: 8 (ref 5–15)
BUN: 16 mg/dL (ref 6–20)
CO2: 24 mmol/L (ref 22–32)
Calcium: 8.7 mg/dL — ABNORMAL LOW (ref 8.9–10.3)
Chloride: 107 mmol/L (ref 98–111)
Creatinine, Ser: 0.75 mg/dL (ref 0.61–1.24)
GFR, Estimated: >60 mL/min (ref >60)
Glucose, Bld: 105 mg/dL — ABNORMAL HIGH (ref 70–99)
Potassium: 3.4 mmol/L — ABNORMAL LOW (ref 3.5–5.1)
Sodium: 139 mmol/L (ref 135–145)
Total Bilirubin: 0.9 mg/dL (ref 0.3–1.2)
Total Protein: 7.2 g/dL (ref 6.5–8.1)

## 2021-02-06 LAB — CBC
HCT: 41.1 % (ref 39.0–52.0)
Hemoglobin: 13.7 g/dL (ref 13.0–17.0)
MCH: 29.1 pg (ref 26.0–34.0)
MCHC: 33.3 g/dL (ref 30.0–36.0)
MCV: 87.4 fL (ref 80.0–100.0)
Platelets: 241 10*3/uL (ref 150–400)
RBC: 4.7 MIL/uL (ref 4.22–5.81)
RDW: 13 % (ref 11.5–15.5)
WBC: 6.2 10*3/uL (ref 4.0–10.5)
nRBC: 0 % (ref 0.0–0.2)

## 2021-02-06 LAB — GLUCOSE, CAPILLARY
Glucose-Capillary: 162 mg/dL — ABNORMAL HIGH (ref 70–99)
Glucose-Capillary: 283 mg/dL — ABNORMAL HIGH (ref 70–99)
Glucose-Capillary: 216 mg/dL — ABNORMAL HIGH (ref 70–99)
Glucose-Capillary: 308 mg/dL — ABNORMAL HIGH (ref 70–99)

## 2021-02-06 LAB — HIV ANTIBODY (ROUTINE TESTING W REFLEX): HIV Screen 4th Generation wRfx: NONREACTIVE

## 2021-02-06 MED ORDER — CHLORHEXIDINE GLUCONATE 0.12 % MT SOLN
15.0000 mL | Freq: Two times a day (BID) | OROMUCOSAL | Status: DC
  Administered 2021-02-06 – 2021-02-09 (×6): 15 mL via OROMUCOSAL
  Filled 2021-02-06 (×6): qty 15

## 2021-02-06 MED ORDER — ORAL CARE MOUTH RINSE
15.0000 mL | Freq: Two times a day (BID) | OROMUCOSAL | Status: DC
  Administered 2021-02-08 – 2021-02-09 (×3): 15 mL via OROMUCOSAL

## 2021-02-06 NOTE — Progress Notes (Signed)
PROGRESS NOTE    Samuel Walton  NTI:[id number] DOB: June 25, 1965 DOA: 02/05/2021 PCP: Pcp, No   Brief Narrative:  55 y.o. 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  Patient has 1 day he says of his stump on the right side.  He apparently has a wound VAC but has been staying away from his home over in [place] with a girlfriend.  Apparently his Leviste duration was not ideal and hemoglobin able to use his wound VAC.  Has not been able to take care of himself.  There is report of a lot of drugs involved as patient has also past history of cocaine abuse and the environment was not conducive for his care.  He has therefore not getting wound care and other medications.  Came to the ER was copious discharge from the stump and cellulitis surrounding the area.  While in the ER patient also voiced suicidal ideation but no plans.  He appears to be frustrated about his living condition.  Psych consult was called for and patient now denies suicidal ideation.  He has also voiced desire to have detoxification from drugs including cocaine.  He apparently had necrotizing fasciitis in the leg which led to initial surgery.  He is therefore being admitted for IV antibiotics as well as other supportive care..  Stump was evaluated.  Patient not septic or toxic.  Imaging negative for soft tissue gas.  WOCN consult.  Wound VAC to be replaced 8/30   Assessment & Plan:   Principal Problem:   Infected wound Active Problems:   Cocaine use disorder (HCC)   Necrotizing soft tissue infection   Tobacco use disorder   Type 1 diabetes (HCC)   Depressive disorder  Infected stump status post right AKA Patient with infected stump Secondary to nonadherence with medications and treatment Wound  VAC was removed at some point Purulent drainage noted from stump site Imaging negative for bone involvement or soft tissue gas Plan: W OC consult Replace wound VAC Continue broad-spectrum antibiotics for today As needed pain control, patient pain-free on my exam today Follow blood and wound cultures AKA was done at [facility].  Considering hemodynamic stability and no bone involvement or soft tissue gas seen on imaging will defer surgical consultation for now.  If patient clinically deteriorates consider consultation with vascular or general surgery.  Insulin-dependent diabetes mellitus Basal bolus regimen Sliding scale coverage Carb modified diet  Polysubstance abuse Tobacco abuse UDS positive for cocaine and cannabis Patient interested in quitting TOC consult for substance abuse resources  Depression with possible suicidal ideation On my interview patient denied suicidal intent or ideation Consider inpatient psychiatry consult   DVT prophylaxis: SQ Lovenox Code Status: Full Family Communication: None today Disposition Plan: Status is: Inpatient  Remains inpatient appropriate because:Inpatient level of care appropriate due to severity of illness  Dispo: The patient is from: Home              Anticipated d/c is to: Home              Patient currently is not medically stable to d/c.   Difficult to place patient No       Level of care: Med-Surg  Consultants:  None  Procedures:  None  Antimicrobials:  Vancomycin Cefepime   Subjective: Seen and examined.  Resting  comfortably in bed.  No visible distress.  Answers her questions appropriately.  Vehemently denies suicidal ideation or intent  Objective: Vitals:   02/06/21 0054 02/06/21 0150 02/06/21 0453 02/06/21 0852  BP: 137/76 127/75 117/77 117/78  Pulse: 89 88 95 88  Resp: 16 18 17 18   Temp: 97.9 F (36.6 C) 99.2 F (37.3 C) 99.1 F (37.3 C) 99.1 F (37.3 C)  TempSrc: Oral  Oral Oral  SpO2: 97% 96% 92% 96%   Weight:      Height:        Intake/Output Summary (Last 24 hours) at 02/06/2021 1035 Last data filed at 02/06/2021 0900 Gross per 24 hour  Intake 751.78 ml  Output 600 ml  Net 151.78 ml   Filed Weights   02/05/21 1457  Weight: 63.5 kg    Examination:  General exam: Appears calm and comfortable, appears chronically ill Respiratory system: Clear to auscultation. Respiratory effort normal. Cardiovascular system: S1-S2, regular rate and rhythm, no murmurs, no pedal edema Gastrointestinal system: Soft, nontender, nondistended, normal bowel sounds Central nervous system: Alert and oriented. No focal neurological deficits. Extremities: Status post right AKA Skin: No rashes, lesions or ulcers Psychiatry: Judgement and insight appear normal. Mood & affect appropriate.     Data Reviewed: I have personally reviewed following labs and imaging studies  CBC: Recent Labs  Lab 02/05/21 1502 02/06/21 0405  WBC 12.5* 6.2  HGB 13.6 13.7  HCT 39.3 41.1  MCV 87.7 87.4  PLT 241 241   Basic Metabolic Panel: Recent Labs  Lab 02/05/21 1502 02/06/21 0405  NA 134* 139  K 3.7 3.4*  CL 101 107  CO2 21* 24  GLUCOSE 104* 105*  BUN 20 16  CREATININE 0.76 0.75  CALCIUM 9.2 8.7*   GFR: Estimated Creatinine Clearance: 93.7 mL/min (by C-G formula based on SCr of 0.75 mg/dL). Liver Function Tests: Recent Labs  Lab 02/06/21 0405  AST 21  ALT 20  ALKPHOS 64  BILITOT 0.9  PROT 7.2  ALBUMIN 3.5   No results for input(s): LIPASE, AMYLASE in the last 168 hours. No results for input(s): AMMONIA in the last 168 hours. Coagulation Profile: No results for input(s): INR, PROTIME in the last 168 hours. Cardiac Enzymes: No results for input(s): CKTOTAL, CKMB, CKMBINDEX, TROPONINI in the last 168 hours. BNP (last 3 results) No results for input(s): PROBNP in the last 8760 hours. HbA1C: No results for input(s): HGBA1C in the last 72 hours. CBG: Recent Labs  Lab 02/05/21 2343  02/06/21 0859  GLUCAP 271* 162*   Lipid Profile: No results for input(s): CHOL, HDL, LDLCALC, TRIG, CHOLHDL, LDLDIRECT in the last 72 hours. Thyroid Function Tests: No results for input(s): TSH, T4TOTAL, FREET4, T3FREE, THYROIDAB in the last 72 hours. Anemia Panel: No results for input(s): VITAMINB12, FOLATE, FERRITIN, TIBC, IRON, RETICCTPCT in the last 72 hours. Sepsis Labs: No results for input(s): PROCALCITON, LATICACIDVEN in the last 168 hours.  Recent Results (from the past 240 hour(s))  Resp Panel by RT-PCR (Flu A&B, Covid) Nasopharyngeal Swab     Status: None   Collection Time: 02/05/21  8:29 PM   Specimen: Nasopharyngeal Swab; Nasopharyngeal(NP) swabs in vial transport medium  Result Value Ref Range Status   SARS Coronavirus 2 by RT PCR NEGATIVE NEGATIVE Final    Comment: (NOTE) SARS-CoV-2 target nucleic acids are NOT DETECTED.  The SARS-CoV-2 RNA is generally detectable in upper respiratory specimens during the acute phase of infection. The lowest concentration of SARS-CoV-2 viral copies this assay can  detect is 138 copies/mL. A negative result does not preclude SARS-Cov-2 infection and should not be used as the sole basis for treatment or other patient management decisions. A negative result may occur with  improper specimen collection/handling, submission of specimen other than nasopharyngeal swab, presence of viral mutation(s) within the areas targeted by this assay, and inadequate number of viral copies(<138 copies/mL). A negative result must be combined with clinical observations, patient history, and epidemiological information. The expected result is Negative.  Fact Sheet for Patients:  BloggerCourse.comhttps://www.fda.gov/media/152166/download  Fact Sheet for Healthcare Providers:  SeriousBroker.ithttps://www.fda.gov/media/152162/download  This test is no t yet approved or cleared by the [place] FDA and  has been authorized for detection and/or diagnosis of SARS-CoV-2 by FDA under an  Emergency Use Authorization (EUA). This EUA will remain  in effect (meaning this test can be used) for the duration of the COVID-19 declaration under Section 564(b)(1) of the Act, 21 U.S.C.section 360bbb-3(b)(1), unless the authorization is terminated  or revoked sooner.       Influenza A by PCR NEGATIVE NEGATIVE Final   Influenza B by PCR NEGATIVE NEGATIVE Final    Comment: (NOTE) The Xpert Xpress SARS-CoV-2/FLU/RSV plus assay is intended as an aid in the diagnosis of influenza from Nasopharyngeal swab specimens and should not be used as a sole basis for treatment. Nasal washings and aspirates are unacceptable for Xpert Xpress SARS-CoV-2/FLU/RSV testing.  Fact Sheet for Patients: BloggerCourse.comhttps://www.fda.gov/media/152166/download  Fact Sheet for Healthcare Providers: SeriousBroker.ithttps://www.fda.gov/media/152162/download  This test is not yet approved or cleared by the [place] FDA and has been authorized for detection and/or diagnosis of SARS-CoV-2 by FDA under an Emergency Use Authorization (EUA). This EUA will remain in effect (meaning this test can be used) for the duration of the COVID-19 declaration under Section 564(b)(1) of the Act, 21 U.S.C. section 360bbb-3(b)(1), unless the authorization is terminated or revoked.  Performed at [facility], [address] [place], [place] [address]          Radiology Studies: CT HEAD WO CONTRAST (5MM)  Result Date: 02/05/2021 CLINICAL DATA:  Status post trauma. EXAM: CT HEAD WITHOUT CONTRAST TECHNIQUE: Contiguous axial images were obtained from the base of the skull through the vertex without intravenous contrast. COMPARISON:  None. FINDINGS: Brain: No evidence of acute infarction, hemorrhage, hydrocephalus, extra-axial collection or mass lesion/mass effect. Vascular: No hyperdense vessel or unexpected calcification. Skull: Normal. Negative for fracture or focal lesion. Sinuses/Orbits: No acute finding. Other: Very mild left  parieto-occipital scalp soft tissue swelling is seen. IMPRESSION: No acute intracranial pathology. Electronically Signed   By: Aram Candelahaddeus  Houston M.D.   On: 02/05/2021 21:30   CT Cervical Spine Wo Contrast  Result Date: 02/05/2021 CLINICAL DATA:  Status post fall. EXAM: CT CERVICAL SPINE WITHOUT CONTRAST TECHNIQUE: Multidetector CT imaging of the cervical spine was performed without intravenous contrast. Multiplanar CT image reconstructions were also generated. COMPARISON:  None. FINDINGS: Alignment: Normal. Skull base and vertebrae: No acute fracture. No primary bone lesion or focal pathologic process. Soft tissues and spinal canal: No prevertebral fluid or swelling. No visible canal hematoma. Disc levels: Mild anterior osteophyte formation is seen at the levels of C4-C5 and C5-C6. Normal multilevel intervertebral disc spaces are present. Normal bilateral multilevel facet joints are noted. Upper chest: Negative. Other: None. IMPRESSION: 1. No acute fracture or subluxation of the cervical spine. 2. Mild degenerative changes at the levels of C4-C5 and C5-C6. Electronically Signed   By: Aram Candelahaddeus  Houston M.D.   On: 02/05/2021 21:33  DG Femur Min 2 Views Right  Result Date: 02/05/2021 CLINICAL DATA:  Right leg/thigh amputation, cellulitis, evaluate for soft tissue gas EXAM: RIGHT FEMUR 2 VIEWS COMPARISON:  None. FINDINGS: Status post right leg amputation. Mild soft tissue swelling is possible in this patient with reported history of cellulitis. However, there is no radiographic findings to suggest soft tissue gas in this location. Mild degenerative changes of the lower lumbar spine. IMPRESSION: Status post right leg amputation. No radiographic findings to suggest soft tissue gas. Electronically Signed   By: Charline Bills M.D.   On: 02/05/2021 22:40        Scheduled Meds:  atorvastatin  20 mg Oral Daily   enoxaparin (LOVENOX) injection  40 mg Subcutaneous Q24H   gabapentin  400 mg Oral TID    insulin aspart  0-20 Units Subcutaneous TID WC   insulin aspart  0-5 Units Subcutaneous QHS   insulin detemir  18 Units Subcutaneous BID   lisinopril  2.5 mg Oral Daily   Continuous Infusions:  sodium chloride 100 mL/hr at 02/06/21 0650   ceFEPime (MAXIPIME) IV Stopped (02/06/21 0459)   vancomycin       LOS: 1 day    Time spent: 35 minutes    Tresa Moore, MD [facility] Pager [phone]  If 7PM-7AM, please contact night-coverage 02/06/2021, 10:35 AM

## 2021-02-06 NOTE — Consult Note (Signed)
Pharmacy Antibiotic Note  Samuel Walton is a 55 y.o. 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  Pharmacy has been consulted for vancomycin and cefepime dosing.  Pt has been afebrile since admin with WBC trend: 12.5>6.2.   Cultures were not obtained, and pt received vanc and zosyn x1 in ED  Plan: -Vancomycin   -Given vancomycin 1500mg  IV x1 in ED   -Will continue 1000mg  IV q12h     -Expected AUC: 532.3     -Css min: 14.1     -Wt used for CrCl and Ke: TBW     -Vd: 0.72   -Will obtain labs around 4th or 5th dose if continued   -Cefepime   -Will continue 2g IV q8h based on renal function   -Will continue to monitor renal function and adjust dose as clinically indicated    Height: 5\' 7"  (170.2 cm) Weight: 63.5 kg (140 lb) IBW/kg (Calculated) : 66.1  Temp (24hrs), Avg:98.6 F (37 C), Min:97.9 F (36.6 C), Max:99.2 F (37.3 C)  Recent Labs  Lab 02/05/21 1502 02/06/21 0405  WBC 12.5* 6.2  CREATININE 0.76 0.75    Estimated Creatinine Clearance: 93.7 mL/min (by C-G formula based on SCr of 0.75 mg/dL).    No Known Allergies  Antimicrobials this admission: Zosyn 8/29 >> 8/29 x1 Vanc 08/29 >>  Cefepime 08/30 >>  Thank you for allowing pharmacy to be a part of this patient's care.  9/29, PharmD Pharmacy Resident  02/06/2021 8:11 AM

## 2021-02-06 NOTE — Consult Note (Signed)
WOC Nurse Consult Note: Reason for Consult: Apply NPWT dressing to right LE amputation site. Patient missed [facility] appointment on Friday and has not had dressing changed since Tuesday, 8/23.  Wound type: Surgical. Full thickness Pressure Injury POA: N/A Measurement: 2.2cm x 6.6cm x 0.3cm Wound bed: 90% red, moist, 10% yellow slough Drainage (amount, consistency, odor) Moderate serous to light yellow Periwound: puckered and with deep crevices at 3 and 9 o'clock. Dressing procedure/placement/frequency: Wound is cleansed with soap and water, rinse and patted dry. Skin barrier ring is used to fill the deep crevices at 3 and 9 o'clock and then another skin barrier ring is used to encircle the wound.  1 piece of black foam is used to obliterate dead space and drape is applied. The dressing is attached to continuous negative pressure and an immediate seal is achieved.  The dressing is to be changed twice weekly on Tuesdays and Fridays.  Next dressing change is to be on Friday, 02/09/21.  Patient to resume with [facility] post discharge from [facility].  WOC nursing team will follow, and will remain available to this patient, the nursing and medical teams.   Thanks, Ladona Mow, MSN, RN, GNP, [place]  Pager# [phone]

## 2021-02-06 NOTE — Clinical Social Work Note (Addendum)
CSW acknowledges consult for SA resources and medication assistance. TTS counselor, Andee Poles, LCAS-A provided patient with SA resources in the ED. Patient does not have insurance or a PCP. Will follow for discharge medication recommendations and follow up with patient at that time.  Charlynn Court, CSW [phone]  9:54 am: According to chart review, patient is active with [facility]. CSW called and spoke to representative who confirmed he is active for nursing for wound vac changes. Vac company is [employer]. PCP is Simone Curia, MD. Will notify [facility] when patient is discharging and fax over new orders.  Charlynn Court, CSW [phone]

## 2021-02-06 NOTE — Progress Notes (Signed)
Patient arrived to unit, A&O x4, oriented to call bell, bed alarm on. No complaints of pain. Kept dosing off when trying to ask admission questions then stated "It's too early right now." Will let day shift nurse know.

## 2021-02-07 LAB — BASIC METABOLIC PANEL
Anion gap: 6 (ref 5–15)
BUN: 13 mg/dL (ref 6–20)
CO2: 24 mmol/L (ref 22–32)
Calcium: 8.4 mg/dL — ABNORMAL LOW (ref 8.9–10.3)
Chloride: 105 mmol/L (ref 98–111)
Creatinine, Ser: 0.66 mg/dL (ref 0.61–1.24)
GFR, Estimated: >60 mL/min (ref >60)
Glucose, Bld: 370 mg/dL — ABNORMAL HIGH (ref 70–99)
Potassium: 3.9 mmol/L (ref 3.5–5.1)
Sodium: 135 mmol/L (ref 135–145)

## 2021-02-07 LAB — HEMOGLOBIN A1C
Hgb A1c MFr Bld: 7.8 % — ABNORMAL HIGH (ref 4.8–5.6)
Mean Plasma Glucose: 177 mg/dL

## 2021-02-07 LAB — CBC
HCT: 36.3 % — ABNORMAL LOW (ref 39.0–52.0)
Hemoglobin: 12.5 g/dL — ABNORMAL LOW (ref 13.0–17.0)
MCH: 30.6 pg (ref 26.0–34.0)
MCHC: 34.4 g/dL (ref 30.0–36.0)
MCV: 89 fL (ref 80.0–100.0)
Platelets: 203 10*3/uL (ref 150–400)
RBC: 4.08 MIL/uL — ABNORMAL LOW (ref 4.22–5.81)
RDW: 12.8 % (ref 11.5–15.5)
WBC: 4.2 10*3/uL (ref 4.0–10.5)
nRBC: 0 % (ref 0.0–0.2)

## 2021-02-07 LAB — GLUCOSE, CAPILLARY
Glucose-Capillary: 220 mg/dL — ABNORMAL HIGH (ref 70–99)
Glucose-Capillary: 148 mg/dL — ABNORMAL HIGH (ref 70–99)
Glucose-Capillary: 158 mg/dL — ABNORMAL HIGH (ref 70–99)
Glucose-Capillary: 225 mg/dL — ABNORMAL HIGH (ref 70–99)

## 2021-02-07 LAB — MAGNESIUM: Magnesium: 2 mg/dL (ref 1.7–2.4)

## 2021-02-07 LAB — VITAMIN D 25 HYDROXY (VIT D DEFICIENCY, FRACTURES): Vit D, 25-Hydroxy: 35.62 ng/mL (ref 30–100)

## 2021-02-07 LAB — PHOSPHORUS: Phosphorus: 2.5 mg/dL (ref 2.5–4.6)

## 2021-02-07 MED ORDER — INSULIN ASPART 100 UNIT/ML IJ SOLN
0.0000 [IU] | Freq: Three times a day (TID) | INTRAMUSCULAR | Status: DC
  Administered 2021-02-07: 2 [IU] via SUBCUTANEOUS
  Administered 2021-02-08 (×3): 5 [IU] via SUBCUTANEOUS
  Administered 2021-02-09: 3 [IU] via SUBCUTANEOUS
  Administered 2021-02-09: 5 [IU] via SUBCUTANEOUS
  Filled 2021-02-07 (×6): qty 1

## 2021-02-07 MED ORDER — INSULIN ASPART 100 UNIT/ML IJ SOLN
4.0000 [IU] | Freq: Three times a day (TID) | INTRAMUSCULAR | Status: DC
  Administered 2021-02-07 – 2021-02-09 (×7): 4 [IU] via SUBCUTANEOUS
  Filled 2021-02-07 (×7): qty 1

## 2021-02-07 MED ORDER — INSULIN ASPART 100 UNIT/ML IJ SOLN
0.0000 [IU] | Freq: Every day | INTRAMUSCULAR | Status: DC
  Administered 2021-02-07 – 2021-02-08 (×2): 2 [IU] via SUBCUTANEOUS
  Filled 2021-02-07 (×2): qty 1

## 2021-02-07 MED ORDER — OXYCODONE HCL 5 MG PO TABS: 5.0000 mg | ORAL_TABLET | Freq: Four times a day (QID) | ORAL | Status: DC | PRN

## 2021-02-07 MED ORDER — ACETAMINOPHEN 325 MG PO TABS
650.0000 mg | ORAL_TABLET | Freq: Four times a day (QID) | ORAL | Status: DC
Start: 2021-02-07 — End: 2021-02-09
  Administered 2021-02-07 – 2021-02-09 (×8): 650 mg via ORAL
  Filled 2021-02-07 (×9): qty 2

## 2021-02-07 MED ORDER — ENSURE MAX PROTEIN PO LIQD
11.0000 [oz_av] | Freq: Two times a day (BID) | ORAL | Status: DC
  Administered 2021-02-07 – 2021-02-09 (×4): 11 [oz_av] via ORAL
  Filled 2021-02-07: qty 330

## 2021-02-07 MED ORDER — ACETAMINOPHEN 325 MG PO TABS: 650.0000 mg | ORAL_TABLET | Freq: Four times a day (QID) | ORAL | Status: DC | PRN

## 2021-02-07 NOTE — Consult Note (Signed)
WOC Nurse wound follow up Message from nurse, Vernona Rieger.  VAC is alarming and blockage.  Instructed nurse to remove old dressing and apply NS moist gauze and cover with dry dressing and kerlix.  WOC team will replace dressing in AM.  This Clinical research associate in meeting this PM.    Will follow and my partner will see in AM.  Maple Hudson MSN, RN, FNP-BC CWON Wound, Ostomy, Continence Nurse Pager [phone]

## 2021-02-07 NOTE — Consult Note (Signed)
Pharmacy Antibiotic Note  Samuel Walton is a 55 y.o. 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  Pharmacy has been consulted for vancomycin and cefepime dosing.  Pt has been afebrile since admin with WBC trend: 12.5>6.2>4.2.   Cultures were not obtained in ED, but were collected 8/30  Plan: -Vancomycin   -Will continue 1000mg  IV q12h     -Expected AUC: 532.3     -Css min: 14.1     -Wt used for CrCl and Ke: TBW     -Scr used: 0.8     -Vd: 0.72   -Will obtain labs around 4th or 5th dose if continued   -Cefepime   -Will continue 2g IV q8h based on renal function   -Will continue to monitor renal function and adjust dose as clinically indicated    Height: 5\' 7"  (170.2 cm) Weight: 63.5 kg (140 lb) IBW/kg (Calculated) : 66.1  Temp (24hrs), Avg:98.4 F (36.9 C), Min:97.8 F (36.6 C), Max:99.1 F (37.3 C)  Recent Labs  Lab 02/05/21 1502 02/06/21 0405  WBC 12.5* 6.2  CREATININE 0.76 0.75     Estimated Creatinine Clearance: 93.7 mL/min (by C-G formula based on SCr of 0.75 mg/dL).    No Known Allergies  Antimicrobials this admission: Zosyn 8/29 >> 8/29 x1 Vanc 08/29 >>  Cefepime 08/30 >>  Cultures Bcx: no growth <24h Wound culture: no growth <24h  Thank you for allowing pharmacy to be a part of this patient's care.  9/29, PharmD Pharmacy Resident  02/07/2021 2:05 PM

## 2021-02-07 NOTE — Progress Notes (Signed)
[facility] Progress Note  Patient: Samuel Walton    CZY:[id number]  DOA: 02/05/2021     Date of Service: the patient was seen and examined on 02/07/2021  Chief Complaint  Patient presents with   Drug Problem   Medication Refill   Brief hospital course: 55 y.o. 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  Patient has 1 day he says of his stump on the right side.  He apparently has a wound VAC but has been staying away from his home over in [place] with a girlfriend.  Apparently his Leviste duration was not ideal and hemoglobin able to use his wound VAC.  Has not been able to take care of himself.  There is report of a lot of drugs involved as patient has also past history of cocaine abuse and the environment was not conducive for his care.  He has therefore not getting wound care and other medications.  Came to the ER was copious discharge from the stump and cellulitis surrounding the area.  While in the ER patient also voiced suicidal ideation but no plans.  He appears to be frustrated about his living condition.  Psych consult was called for and patient now denies suicidal ideation.  He has also voiced desire to have detoxification from drugs including cocaine.  He apparently had necrotizing fasciitis in the leg which led to initial surgery.  He is therefore being admitted for IV antibiotics as well as other supportive care..   Stump was evaluated.  Patient not septic or toxic.  Imaging negative for soft tissue gas.  WOCN consult.  Wound VAC to be replaced 8/30      Assessment and Plan: Principal Problem:   Infected wound Active Problems:   Cocaine use disorder (HCC)   Necrotizing soft tissue infection   Tobacco use disorder   Type 1 diabetes (HCC)    Depressive disorder   Infected stump status post right AKA Patient with infected stump Secondary to nonadherence with medications and treatment Wound VAC was removed at some point Purulent drainage noted from stump site Imaging negative for bone involvement or soft tissue gas Plan: W OC consult Replace wound VAC Continue broad-spectrum antibiotics As needed pain control Follow blood Cx NGTD and wound cultures AKA was done at [facility].  Considering hemodynamic stability and no bone involvement or soft tissue gas seen on imaging will defer surgical consultation for now.  If patient clinically deteriorates consider consultation with vascular or general surgery.   Back pain s/p fall Continue as needed medication for pain control CT cervical spine did not show any acute finding Will reassess tomorrow a.m. for the need of T and L-spine CT scan  Insulin-dependent diabetes mellitus Basal bolus regimen Sliding scale coverage Carb modified diet   Polysubstance abuse Tobacco abuse UDS positive for cocaine and cannabis Patient interested in quitting TOC consult for substance abuse resources   Depression with possible suicidal ideation On my interview patient denied suicidal intent or ideation Consider inpatient psychiatry consult   Body mass index is 21.93 kg/m.  Interventions:       Diet: Carb modified DVT Prophylaxis: Subcutaneous Lovenox   Advance goals of care discussion: Full code  Family Communication: family was NOT present at bedside, at the time of interview.  The pt provided permission to discuss  medical plan with the family. Opportunity was given to ask question and all questions were answered satisfactorily.   Disposition:  Pt is from Home, admitted with infected wound, still has wound VAC attached and on IV antibiotics,  which precludes a safe discharge. Discharge to SNF, when pain improves  Subjective: No significant overnight events, patient is still complaining  of significant pain in the back and at gets worse on movement.  Denies any pain in the right stump. Patient denies any chest pain, no shortness of breath, no any other active issues  Physical Exam: General:  alert oriented to time, place, and person.  Appear in mild distress, affect appropriate Eyes: PERRLA ENT: Oral Mucosa Clear, moist  Neck: no JVD,  Cardiovascular: S1 and S2 Present, no Murmur,  Respiratory: good respiratory effort, Bilateral Air entry equal and Decreased, no Crackles, no wheezes Abdomen: Bowel Sound present, Soft and no tenderness,  Skin: no rashes Extremities: no Pedal edema, no calf tenderness, s/p Right AKA and wound VAC attached Neurologic: without any new focal findings Gait not checked due to patient safety concerns  Vitals:   02/06/21 1524 02/06/21 2042 02/07/21 0425 02/07/21 0727  BP: 121/77 124/82 117/76 117/77  Pulse: 92 73 76 72  Resp:  18 18 16   Temp: 97.8 F (36.6 C) 98.4 F (36.9 C) 98.1 F (36.7 C) 98.3 F (36.8 C)  TempSrc:  Oral Oral Oral  SpO2: 98% 98% 96% 97%  Weight:      Height:        Intake/Output Summary (Last 24 hours) at 02/07/2021 1440 Last data filed at 02/07/2021 1414 Gross per 24 hour  Intake 3102.36 ml  Output 2825 ml  Net 277.36 ml   Filed Weights   02/05/21 1457  Weight: 63.5 kg    Data Reviewed: I have personally reviewed and interpreted daily labs, tele strips, imagings as discussed above. I reviewed all nursing notes, pharmacy notes, vitals, pertinent old records I have discussed plan of care as described above with RN and patient/family.  CBC: Recent Labs  Lab 02/05/21 1502 02/06/21 0405 02/07/21 0929  WBC 12.5* 6.2 4.2  HGB 13.6 13.7 12.5*  HCT 39.3 41.1 36.3*  MCV 87.7 87.4 89.0  PLT 241 241 203   Basic Metabolic Panel: Recent Labs  Lab 02/05/21 1502 02/06/21 0405 02/07/21 0929  NA 134* 139 135  K 3.7 3.4* 3.9  CL 101 107 105  CO2 21* 24 24  GLUCOSE 104* 105* 370*  BUN 20 16 13    CREATININE 0.76 0.75 0.66  CALCIUM 9.2 8.7* 8.4*  MG  --   --  2.0  PHOS  --   --  2.5    Studies: No results found.  Scheduled Meds:  atorvastatin  20 mg Oral Daily   chlorhexidine  15 mL Mouth Rinse BID   enoxaparin (LOVENOX) injection  40 mg Subcutaneous Q24H   gabapentin  400 mg Oral TID   insulin aspart  0-5 Units Subcutaneous QHS   insulin aspart  0-9 Units Subcutaneous TID WC   insulin aspart  4 Units Subcutaneous TID WC   insulin detemir  18 Units Subcutaneous BID   lisinopril  2.5 mg Oral Daily   mouth rinse  15 mL Mouth Rinse q12n4p   Continuous Infusions:  sodium chloride 100 mL/hr at 02/07/21 0644   ceFEPime (MAXIPIME) IV 2 g (02/07/21 1345)   vancomycin 1,000 mg (02/07/21 1216)   PRN Meds: morphine injection, ondansetron **OR** ondansetron (ZOFRAN) IV  Time  spent: 35 minutes  Author: Gillis Santa. MD [facility] 02/07/2021 2:40 PM  To reach On-call, see care teams to locate the attending and reach out to them via [email or web address]. If 7PM-7AM, please contact night-coverage If you still have difficulty reaching the attending provider, please page the [facility] (Director on Call) for [facility] on amion for assistance.

## 2021-02-07 NOTE — Progress Notes (Addendum)
Inpatient Diabetes Program Recommendations  AACE/ADA: New Consensus Statement on Inpatient Glycemic Control (2015)  Target Ranges:  Prepandial:   less than 140 mg/dL      Peak postprandial:   less than 180 mg/dL (1-2 hours)      Critically ill patients:  140 - 180 mg/dL   Lab Results  Component Value Date   GLUCAP 220 (H) 02/07/2021   HGBA1C 7.8 (H) 02/06/2021    Review of Glycemic Control Results for Samuel Walton, Samuel Walton (MRN [id number]) as of 02/07/2021 10:18  Ref. Range 02/06/2021 08:59 02/06/2021 11:33 02/06/2021 16:33 02/06/2021 21:34 02/07/2021 07:26  Glucose-Capillary Latest Ref Range: 70 - 99 mg/dL 631 (H) 497 (H) 026 (H) 308 (H) 220 (H)   Diabetes history: DM2 Outpatient Diabetes medications: Home: NPH 18 BID  Current orders for Inpatient glycemic control: Levemir 18 units bid + Novolog 0-20 units tid, 0-5 units hs  A1c 7.8  Inpatient Diabetes Program Recommendations:   Noted postprandial CBGs elevated. -Add Novolog 4 units tid meal coverage if eats 50% Secure chat sent to Dr. Lucianne Muss. May also need to decrease Novolog correction to 0-9 units tid + hs 0-5 units. Secure chat sent to Dr. Lucianne Muss.  Thank you, Billy Fischer. Shinika Estelle, RN, MSN, CDE  Diabetes Coordinator Inpatient Glycemic Control Team Team Pager [phone] (8am-5pm) 02/07/2021 10:19 AM

## 2021-02-07 NOTE — Consult Note (Signed)
WOC Nurse Consult Note: Reason for Consult: Re-consulted for [facility] management.  Wound dressing was changed Tuesday (yesterday) after being in place at home for 7 days.  Dressing changes ordered for Tuesday and Friday per [facility] care.  Dressing change is due 02/09/21 via [facility] nurse team.  Will follow.   Maple Hudson MSN, RN, FNP-BC CWON Wound, Ostomy, Continence Nurse Pager [phone]

## 2021-02-08 DIAGNOSIS — L8990 Pressure ulcer of unspecified site, unspecified stage: Secondary | ICD-10-CM | POA: Insufficient documentation

## 2021-02-08 LAB — GLUCOSE, CAPILLARY
Glucose-Capillary: 273 mg/dL — ABNORMAL HIGH (ref 70–99)
Glucose-Capillary: 289 mg/dL — ABNORMAL HIGH (ref 70–99)
Glucose-Capillary: 261 mg/dL — ABNORMAL HIGH (ref 70–99)
Glucose-Capillary: 203 mg/dL — ABNORMAL HIGH (ref 70–99)

## 2021-02-08 LAB — CBC
HCT: 38 % — ABNORMAL LOW (ref 39.0–52.0)
Hemoglobin: 13.2 g/dL (ref 13.0–17.0)
MCH: 30.5 pg (ref 26.0–34.0)
MCHC: 34.7 g/dL (ref 30.0–36.0)
MCV: 87.8 fL (ref 80.0–100.0)
Platelets: 209 10*3/uL (ref 150–400)
RBC: 4.33 MIL/uL (ref 4.22–5.81)
RDW: 12.7 % (ref 11.5–15.5)
WBC: 5.6 10*3/uL (ref 4.0–10.5)
nRBC: 0 % (ref 0.0–0.2)

## 2021-02-08 LAB — BASIC METABOLIC PANEL
Anion gap: 5 (ref 5–15)
BUN: 18 mg/dL (ref 6–20)
CO2: 25 mmol/L (ref 22–32)
Calcium: 8.9 mg/dL (ref 8.9–10.3)
Chloride: 109 mmol/L (ref 98–111)
Creatinine, Ser: 0.53 mg/dL — ABNORMAL LOW (ref 0.61–1.24)
GFR, Estimated: >60 mL/min (ref >60)
Glucose, Bld: 148 mg/dL — ABNORMAL HIGH (ref 70–99)
Potassium: 4 mmol/L (ref 3.5–5.1)
Sodium: 139 mmol/L (ref 135–145)

## 2021-02-08 LAB — MAGNESIUM: Magnesium: 2.2 mg/dL (ref 1.7–2.4)

## 2021-02-08 LAB — PHOSPHORUS: Phosphorus: 3.5 mg/dL (ref 2.5–4.6)

## 2021-02-08 NOTE — Progress Notes (Signed)
[facility] Progress Note  Patient: Samuel Walton    GMW:[id number]  DOA: 02/05/2021     Date of Service: the patient was seen and examined on 02/08/2021  Chief Complaint  Patient presents with   Drug Problem   Medication Refill   Brief hospital course: 55 y.o. 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  Patient has 1 day he says of his stump on the right side.  He apparently has a wound VAC but has been staying away from his home over in [place] with a girlfriend.  Apparently his Leviste duration was not ideal and hemoglobin able to use his wound VAC.  Has not been able to take care of himself.  There is report of a lot of drugs involved as patient has also past history of cocaine abuse and the environment was not conducive for his care.  He has therefore not getting wound care and other medications.  Came to the ER was copious discharge from the stump and cellulitis surrounding the area.  While in the ER patient also voiced suicidal ideation but no plans.  He appears to be frustrated about his living condition.  Psych consult was called for and patient now denies suicidal ideation.  He has also voiced desire to have detoxification from drugs including cocaine.  He apparently had necrotizing fasciitis in the leg which led to initial surgery.  He is therefore being admitted for IV antibiotics as well as other supportive care..   Stump was evaluated.  Patient not septic or toxic.  Imaging negative for soft tissue gas.  WOCN consult.  Wound VAC to be replaced 8/30      Assessment and Plan: Principal Problem:   Infected wound Active Problems:   Cocaine use disorder (HCC)   Necrotizing soft tissue infection   Tobacco use disorder   Type 1 diabetes (HCC)    Depressive disorder   Infected stump status post right AKA Patient with infected stump Secondary to nonadherence with medications and treatment Wound VAC was removed at some point Purulent drainage noted from stump site Imaging negative for bone involvement or soft tissue gas Plan: W OC consult Replace wound VAC done on 9/1 Continue broad-spectrum antibiotics As needed pain control Follow blood Cx NGTD and wound cultures still pending AKA was done at [facility].  Considering hemodynamic stability and no bone involvement or soft tissue gas seen on imaging will defer surgical consultation for now.  If patient clinically deteriorates consider consultation with vascular or general surgery.   Back pain s/p fall Continue as needed medication for pain control CT cervical spine did not show any acute finding Complaining of pain on the left side of the neck, no pain in the thoracic or lumbar region No need of further imaging  Insulin-dependent diabetes mellitus Basal bolus regimen Sliding scale coverage Carb modified diet   Polysubstance abuse Tobacco abuse UDS positive for cocaine and cannabis Patient interested in quitting TOC consult for substance abuse resources   Depression with possible suicidal ideation On my interview patient denied suicidal intent or ideation Consider inpatient psychiatry consult   Body mass index is 21.93 kg/m.  Interventions:       Diet: Carb modified DVT Prophylaxis: Subcutaneous Lovenox   Advance goals of care discussion: Full code  Family Communication: family was NOT  present at bedside, at the time of interview.  The pt provided permission to discuss medical plan with the family. Opportunity was given to ask question and all questions were answered satisfactorily.   Disposition:  Pt is from Home, admitted with infected wound, still has wound VAC attached and on IV antibiotics,  which precludes a safe discharge. Discharge to HH/with wound care,  when pain improves, most likely discharge plan tomorrow a.m.   Subjective: No significant overnight events, today pain is well controlled, complaining of pain on the left side of the neck, and pain is well controlled on the left AKA stump, wound VAC was changed today. Patient denied any other active issues. Patient denies any chest pain, no shortness of breath, no any other active issues  Physical Exam: General:  alert oriented to time, place, and person.  Appear in mild distress, affect appropriate Eyes: PERRLA ENT: Oral Mucosa Clear, moist  Neck: no JVD,  Cardiovascular: S1 and S2 Present, no Murmur,  Respiratory: good respiratory effort, Bilateral Air entry equal and Decreased, no Crackles, no wheezes Abdomen: Bowel Sound present, Soft and no tenderness,  Skin: no rashes Extremities: no Pedal edema, no calf tenderness, s/p Right AKA and wound VAC attached Neurologic: without any new focal findings Gait not checked due to patient safety concerns  Vitals:   02/07/21 1519 02/07/21 2003 02/08/21 0415 02/08/21 0723  BP: 118/80 126/71 116/81 114/77  Pulse: 73 71 63 64  Resp: 18 16 16 18   Temp: 98.4 F (36.9 C) 98 F (36.7 C) 97.6 F (36.4 C) 98.6 F (37 C)  TempSrc: Oral Oral Oral   SpO2: 99% 98% 98% 98%  Weight:      Height:        Intake/Output Summary (Last 24 hours) at 02/08/2021 1153 Last data filed at 02/08/2021 1026 Gross per 24 hour  Intake 1673.85 ml  Output 2925 ml  Net -1251.15 ml   Filed Weights   02/05/21 1457  Weight: 63.5 kg    Data Reviewed: I have personally reviewed and interpreted daily labs, tele strips, imagings as discussed above. I reviewed all nursing notes, pharmacy notes, vitals, pertinent old records I have discussed plan of care as described above with RN and patient/family.  CBC: Recent Labs  Lab 02/05/21 1502 02/06/21 0405 02/07/21 0929 02/08/21 0443  WBC 12.5* 6.2 4.2 5.6  HGB 13.6 13.7 12.5* 13.2  HCT 39.3 41.1 36.3* 38.0*  MCV  87.7 87.4 89.0 87.8  PLT 241 241 203 209   Basic Metabolic Panel: Recent Labs  Lab 02/05/21 1502 02/06/21 0405 02/07/21 0929 02/08/21 0443  NA 134* 139 135 139  K 3.7 3.4* 3.9 4.0  CL 101 107 105 109  CO2 21* 24 24 25   GLUCOSE 104* 105* 370* 148*  BUN 20 16 13 18   CREATININE 0.76 0.75 0.66 0.53*  CALCIUM 9.2 8.7* 8.4* 8.9  MG  --   --  2.0 2.2  PHOS  --   --  2.5 3.5    Studies: No results found.  Scheduled Meds:  acetaminophen  650 mg Oral Q6H   atorvastatin  20 mg Oral Daily   chlorhexidine  15 mL Mouth Rinse BID   enoxaparin (LOVENOX) injection  40 mg Subcutaneous Q24H   gabapentin  400 mg Oral TID   insulin aspart  0-5 Units Subcutaneous QHS   insulin aspart  0-9 Units Subcutaneous TID WC   insulin aspart  4 Units Subcutaneous TID WC   insulin detemir  18 Units Subcutaneous BID   lisinopril  2.5 mg Oral Daily   mouth rinse  15 mL Mouth Rinse q12n4p   Ensure Max Protein  11 oz Oral BID   Continuous Infusions:  sodium chloride 75 mL/hr at 02/08/21 0903   ceFEPime (MAXIPIME) IV 2 g (02/08/21 0428)   vancomycin Stopped (02/07/21 2248)   PRN Meds: morphine injection, ondansetron **OR** ondansetron (ZOFRAN) IV, oxyCODONE  Time spent: 35 minutes  Author: Gillis Santa. MD [facility] 02/08/2021 11:53 AM  To reach On-call, see care teams to locate the attending and reach out to them via [email or web address]. If 7PM-7AM, please contact night-coverage If you still have difficulty reaching the attending provider, please page the [facility] (Director on Call) for [facility] on amion for assistance.

## 2021-02-08 NOTE — Consult Note (Signed)
WOC Nurse wound follow up Wound type: Full thickness, surgical.  NPWT dressing still in place with T.R.A.C.C. pad removed (no suction). Dressing removed, wound cleansed. Routine NPWT dressing change. Measurement:2.2cm x 6.6cm x 0.3cm Wound bed:red, nongranulating Drainage (amount, consistency, odor) serous to light yellow Periwound:intact with evidence of previous wound healing (scarring). Deep crevices at 3 and 9 o'clock. Dressing procedure/placement/frequency: Twice weekly (Mondays and Thursdays).  One piece of black foam removed from wound. Wound cleansed, surrounding skin dried. Skin barrier ring used to encircle wound and obliterate crevices at 3 and 9 o'clock. One piece of black foam applied, covered with drape and attached to continuous negative pressure.  An immediate seal is achieved. Patient tolerated procedure well.  Next dressing change is due on Monday, 02/13/21.  WOC nursing team will follow, and will remain available to this patient, the nursing and medical teams.   Thanks, Ladona Mow, MSN, RN, GNP, [place]  Pager# [phone]

## 2021-02-08 NOTE — Progress Notes (Signed)
Inpatient Diabetes Program Recommendations  AACE/ADA: New Consensus Statement on Inpatient Glycemic Control   Target Ranges:  Prepandial:   less than 140 mg/dL      Peak postprandial:   less than 180 mg/dL (1-2 hours)      Critically ill patients:  140 - 180 mg/dL   Results for WARDEN, BUFFA (MRN [id number]) as of 02/08/2021 10:35  Ref. Range 02/07/2021 07:26 02/07/2021 11:52 02/07/2021 16:34 02/07/2021 22:01 02/08/2021 07:25  Glucose-Capillary Latest Ref Range: 70 - 99 mg/dL 419 (H) 622 (H) 297 (H) 225 (H) 273 (H)   Review of Glycemic Control  Current orders for Inpatient glycemic control: Levemir 18 units BID, Novolog 4 units TID with meals, Novolog 0-9 units TID with meals, Novolog 0-5 units QHS  Inpatient Diabetes Program Recommendations:    Insulin: Please consider increasing Levemir to 20 units BID.   Thanks, Orlando Penner, RN, MSN, CDE Diabetes Coordinator Inpatient Diabetes Program [phone] (Team Pager from 8am to 5pm)

## 2021-02-08 NOTE — Consult Note (Signed)
Pharmacy Antibiotic Note  Samuel Walton is a 55 y.o. 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  Pharmacy has been consulted for vancomycin and cefepime dosing.  Pt has been afebrile since admin with WBC trend: 12.5>6.2>4.2>5.6.   Cultures were not obtained in ED, but were collected on 8/30 and have remained negative   Plan: -Vancomycin   -Will continue 1000mg  IV q12h     -Expected AUC: 532.3     -Css min: 14.1     -Wt used for CrCl and Ke: TBW     -Scr used: 0.8     -Vd: 0.72   -Will obtain labs around 4th or 5th dose if continued   -Cefepime   -Will continue 2g IV q8h based on renal function   -Will continue to monitor renal function and adjust dose as clinically indicated    Height: 5\' 7"  (170.2 cm) Weight: 63.5 kg (140 lb) IBW/kg (Calculated) : 66.1  Temp (24hrs), Avg:98.1 F (36.7 C), Min:97.6 F (36.4 C), Max:98.4 F (36.9 C)  Recent Labs  Lab 02/05/21 1502 02/06/21 0405 02/07/21 0929 02/08/21 0443  WBC 12.5* 6.2 4.2 5.6  CREATININE 0.76 0.75 0.66 0.53*     Estimated Creatinine Clearance: 93.7 mL/min (A) (by C-G formula based on SCr of 0.53 mg/dL (L)).    No Known Allergies  Antimicrobials this admission: Zosyn 8/29 >> 8/29 x1 Vanc 08/29 >>  Cefepime 08/30 >>  Cultures Bcx: no growth x2 days Wound culture: no growth x2 days  Thank you for allowing pharmacy to be a part of this patient's care.  9/29, PharmD Pharmacy Resident  02/08/2021 7:10 AM

## 2021-02-08 NOTE — TOC Initial Note (Addendum)
Transition of Care [facility]) - Initial/Assessment Note    Patient Details  Name: Samuel Walton MRN: [id number] Date of Birth: [birth year]  Transition of Care [facility]) CM/SW Contact:    Samuel Chroman, LCSW Phone Number: 02/08/2021, 2:40 PM  Clinical Narrative: CSW met with patient. No supports at bedside. MD said patient requested to speak with CSW due because he thought he did have insurance. CSW called [facility] and they confirmed he is receiving charity services. Patient said he recently paid $25 for his insulin at [employer] so he must have insurance. CSW called [employer] and they he paid cash and they do not have any insurance on file. Financial counselor is checking to see if she can find anything. Patient lives home alone. He is unsure how he will get home and stated he is locked out of his house. He said he will likely have to call a locksmith to get back in. He thinks his friend Samuel Walton might have a spare key but he has been unable to reach him. CSW tried calling as well. Voicemail is not set up. Patient's mother and sister live in [place]. CSW encouraged him to call them to see if they could stay with him for a few days after hospitalization.               3:15 pm: Development worker, community unable to find any listed insurance for patient. Called and let him know. His friend is going to have his sister call him. He's still unsure of how he is going to get back into his house. Printed list of local locksmiths to the unit. Sent secure chat to RN asking him to give to patient next time he goes in the room.  Expected Discharge Plan: [place] Barriers to Discharge: Continued Medical Work up   Patient Goals and CMS Choice     Choice offered to / list presented to : NA  Expected Discharge Plan and Services Expected Discharge Plan: [place] Acute Care Choice: Resumption of Svcs/PTA Provider Living arrangements for the past 2 months: Mobile  Home                                      Prior Living Arrangements/Services Living arrangements for the past 2 months: Mobile Home Lives with:: Self Patient language and need for interpreter reviewed:: Yes Do you feel safe going back to the place where you live?: Yes      Need for Family Participation in Patient Care: Yes (Comment)   Current home services: DME, Home RN Criminal Activity/Legal Involvement Pertinent to Current Situation/Hospitalization: No - Comment as needed  Activities of Daily Living Home Assistive Devices/Equipment: Environmental consultant (specify type), Eyeglasses (front wheeler) ADL Screening (condition at time of admission) Patient's cognitive ability adequate to safely complete daily activities?: Yes Is the patient deaf or have difficulty hearing?: No Does the patient have difficulty seeing, even when wearing glasses/contacts?: No Does the patient have difficulty concentrating, remembering, or making decisions?: No Patient able to express need for assistance with ADLs?: Yes Does the patient have difficulty dressing or bathing?: No Independently performs ADLs?: Yes (appropriate for developmental age) Does the patient have difficulty walking or climbing stairs?: No Weakness of Legs: Left Weakness of Arms/Hands: None  Permission Sought/Granted Permission sought to share information with : Customer service manager, Family Supports  Permission granted to share information with : Yes, Verbal Permission Granted  Share Information with NAME: Samuel Walton  Permission granted to share info w AGENCY: [place] granted to share info w Relationship: Friend  Permission granted to share info w Contact Information: [phone]  Emotional Assessment Appearance:: Appears stated age Attitude/Demeanor/Rapport: Engaged Affect (typically observed): Frustrated Orientation: : Oriented to Self, Oriented to Place, Oriented to  Time, Oriented to  Situation Alcohol / Substance Use: Illicit Drugs Psych Involvement: No (comment)  Admission diagnosis:  Cocaine withdrawal ([place]) [F14.23] Infected wound [T14.8XXA, L08.9] Medication refill [Z76.0] Cellulitis of right lower extremity [L03.115] Hx of AKA (above knee amputation), right ([place]) [Z89.611] Patient Active Problem List   Diagnosis Date Noted   Pressure injury of skin 02/08/2021   Infected wound 02/05/2021   Type 1 diabetes ([place]) 12/09/2020   Tobacco use disorder 11/08/2020   Cocaine use disorder ([place]) 10/21/2020   Depressive disorder 10/21/2020   Necrotizing soft tissue infection 10/08/2020   PCP:  Pcp, No Pharmacy:   [facility] [address], [place] [address] Phone: [phone] Fax: [phone]     Social Determinants of Health (SDOH) Interventions    Readmission Risk Interventions No flowsheet data found.

## 2021-02-09 ENCOUNTER — Other Ambulatory Visit: Payer: Self-pay

## 2021-02-09 LAB — BASIC METABOLIC PANEL
Anion gap: 5 (ref 5–15)
BUN: 23 mg/dL — ABNORMAL HIGH (ref 6–20)
CO2: 25 mmol/L (ref 22–32)
Calcium: 8.8 mg/dL — ABNORMAL LOW (ref 8.9–10.3)
Chloride: 105 mmol/L (ref 98–111)
Creatinine, Ser: 0.59 mg/dL — ABNORMAL LOW (ref 0.61–1.24)
GFR, Estimated: >60 mL/min (ref >60)
Glucose, Bld: 282 mg/dL — ABNORMAL HIGH (ref 70–99)
Potassium: 4.3 mmol/L (ref 3.5–5.1)
Sodium: 135 mmol/L (ref 135–145)

## 2021-02-09 LAB — CBC
HCT: 38.7 % — ABNORMAL LOW (ref 39.0–52.0)
Hemoglobin: 13.3 g/dL (ref 13.0–17.0)
MCH: 30 pg (ref 26.0–34.0)
MCHC: 34.4 g/dL (ref 30.0–36.0)
MCV: 87.2 fL (ref 80.0–100.0)
Platelets: 220 10*3/uL (ref 150–400)
RBC: 4.44 MIL/uL (ref 4.22–5.81)
RDW: 12.4 % (ref 11.5–15.5)
WBC: 4.2 10*3/uL (ref 4.0–10.5)
nRBC: 0 % (ref 0.0–0.2)

## 2021-02-09 LAB — PHOSPHORUS: Phosphorus: 2.6 mg/dL (ref 2.5–4.6)

## 2021-02-09 LAB — MAGNESIUM: Magnesium: 2.3 mg/dL (ref 1.7–2.4)

## 2021-02-09 LAB — GLUCOSE, CAPILLARY
Glucose-Capillary: 258 mg/dL — ABNORMAL HIGH (ref 70–99)
Glucose-Capillary: 240 mg/dL — ABNORMAL HIGH (ref 70–99)
Glucose-Capillary: 258 mg/dL — ABNORMAL HIGH (ref 70–99)

## 2021-02-09 MED ORDER — GABAPENTIN 400 MG PO CAPS
400.0000 mg | ORAL_CAPSULE | Freq: Three times a day (TID) | ORAL | 0 refills | Status: AC
  Filled 2021-02-09: qty 90, 30d supply, fill #0

## 2021-02-09 MED ORDER — LISINOPRIL 5 MG PO TABS
2.5000 mg | ORAL_TABLET | Freq: Every day | ORAL | 0 refills | Status: AC
  Filled 2021-02-09: qty 15, 30d supply, fill #0

## 2021-02-09 MED ORDER — ATORVASTATIN CALCIUM 20 MG PO TABS
20.0000 mg | ORAL_TABLET | Freq: Every day | ORAL | 0 refills | Status: AC
  Filled 2021-02-09: qty 30, 30d supply, fill #0

## 2021-02-09 MED ORDER — INSULIN NPH (HUMAN) (ISOPHANE) 100 UNIT/ML ~~LOC~~ SUSP
18.0000 [IU] | Freq: Two times a day (BID) | SUBCUTANEOUS | 11 refills | Status: DC
  Filled 2021-02-09: qty 10, 28d supply, fill #0

## 2021-02-09 MED ORDER — HUMULIN 70/30 (70-30) 100 UNIT/ML ~~LOC~~ SUSP
15.0000 [IU] | Freq: Two times a day (BID) | SUBCUTANEOUS | 11 refills | Status: AC
  Filled 2021-02-09: qty 10, 31d supply, fill #0

## 2021-02-09 MED ORDER — DOXYCYCLINE HYCLATE 100 MG PO CAPS
100.0000 mg | ORAL_CAPSULE | Freq: Two times a day (BID) | ORAL | 0 refills | Status: AC
  Filled 2021-02-09: qty 10, 5d supply, fill #0

## 2021-02-09 MED ORDER — OXYCODONE HCL 5 MG PO TABS: 5.0000 mg | ORAL_TABLET | Freq: Four times a day (QID) | ORAL | 0 refills | Status: AC | PRN

## 2021-02-09 NOTE — TOC Progression Note (Addendum)
Transition of Care [facility]) - Progression Note    Patient Details  Name: TREYLEN GIBBS MRN: [id number] Date of Birth: May 15, 1966  [facility]) CM/SW Contact  Margarito Liner, LCSW Phone Number: 02/09/2021, 8:16 AM  Clinical Narrative:  Received secure chat from RN late yesterday: "Sister Enid Derry, [phone] called stating he has insurance- [facility] member [id number] phone number is [phone]. You can also reach Antonieta Pert, Mom at [phone]" Sent information to financial counselor to verify.  8:59 am: Received response from financial counselor: "I called [employer] and spoke with [place]. She did confirm active policy effective date 12/14/2020.  I will get this added to his visit." Updated patient.  1:59 pm: Faxed home health orders and WOC notes to [facility].  Expected Discharge Plan: Home w Home Health Services Barriers to Discharge: Continued Medical Work up  Expected Discharge Plan and Services Expected Discharge Plan: Home w Home Health Services     Post Acute Care Choice: Resumption of Svcs/PTA Provider Living arrangements for the past 2 months: Mobile Home                                       Social Determinants of Health (SDOH) Interventions    Readmission Risk Interventions No flowsheet data found.

## 2021-02-09 NOTE — Progress Notes (Signed)
Inpatient Diabetes Program Recommendations  AACE/ADA: New Consensus Statement on Inpatient Glycemic Control (2015)  Target Ranges:  Prepandial:   less than 140 mg/dL      Peak postprandial:   less than 180 mg/dL (1-2 hours)      Critically ill patients:  140 - 180 mg/dL  Results for Samuel Walton, Samuel Walton (MRN [id number]) as of 02/09/2021 06:38  Ref. Range 02/08/2021 07:25 02/08/2021 12:47 02/08/2021 16:36 02/08/2021 20:12  Glucose-Capillary Latest Ref Range: 70 - 99 mg/dL 809 (H)  9 units Novolog  18 units Levemir  289 (H)  9 units Novolog  261 (H)  9 units Novolog  203 (H)  2 units Novolog  18 units Levemir  Results for Samuel Walton, Samuel Walton (MRN [id number]) as of 02/09/2021 08:45  Ref. Range 02/09/2021 07:58  Glucose-Capillary Latest Ref Range: 70 - 99 mg/dL 397 (H)     Home DM Meds: NPH Insulin 18 units BID  Current Orders: Levemir 18 units BID      Novolog Sensitive Correction Scale/ SSI (0-9 units) TID AC + HS      Novolog 4 units TID with meals   MD- Note CBGs elevated all day yesterday and elevated again this AM  Please consider:  1. Increase Levemir to 20 units BID  2. Increase Novolog Meal Coverage to 8 units TID with meals    --Will follow patient during hospitalization--  Ambrose Finland RN, MSN, CDE Diabetes Coordinator Inpatient Glycemic Control Team Team Pager: [phone] (8a-5p)

## 2021-02-09 NOTE — Evaluation (Signed)
Occupational Therapy Evaluation Patient Details Name: Samuel Walton MRN: [id number] DOB: 11/07/1965 Today's Date: 02/09/2021    History of Present Illness 55 y.o. male with medical history significant of diabetes, polysubstance abuse including cocaine, history of right AKA with revision and subsequent near total removal of the right lower extremity, depression, anxiety, hyperlipidemia and neuropathy who came in with complaint of fall   Clinical Impression   Pt was seen for OT evaluation this date. Prior to hospital admission, pt was MOD I for mobility and ADLs using RW. Pt lives alone in mobile home with little to no assistance from friends available. Pt demonstrates baseline independence to perform ADL and mobility tasks. Pt requires no physical assist for ADL t/f using RW, no LOBs standing grooming reaching outside BOS c no UE support (hips on counter), and no assist for LB access at bed level. No skilled OT needs identified. Will sign off. Please re-consult if additional OT needs arise.    Follow Up Recommendations  No OT follow up    Equipment Recommendations  3 in 1 bedside commode    Recommendations for Other Services       Precautions / Restrictions Precautions Precautions: Fall Restrictions Weight Bearing Restrictions: No      Mobility Bed Mobility Overal bed mobility: Independent                  Transfers Overall transfer level: Modified independent Equipment used: Rolling walker (2 wheeled)                  Balance Overall balance assessment: No apparent balance deficits (not formally assessed)                                         ADL either performed or assessed with clinical judgement   ADL Overall ADL's : Modified independent                                       General ADL Comments: no physical assist or LOBs for ADL t/f using RW, standing grooming reaching outside BOS, and LB access at bed level       Pertinent Vitals/Pain Pain Assessment: No/denies pain     Hand Dominance     Extremity/Trunk Assessment Upper Extremity Assessment Upper Extremity Assessment: Overall WFL for tasks assessed   Lower Extremity Assessment Lower Extremity Assessment: RLE deficits/detail;Generalized weakness RLE Deficits / Details: AKA (near total)       Communication Communication Communication: No difficulties   Cognition Arousal/Alertness: Awake/alert Behavior During Therapy: WFL for tasks assessed/performed Overall Cognitive Status: Within Functional Limits for tasks assessed                                     General Comments       Exercises Exercises: Other exercises Other Exercises Other Exercises: Pt educated re: OT role, DME recs, d/c recs, falls prevention, ECS, home/routines modifications Other Exercises: LBD, sup<>sit, sit<>stand, sitting/standing balance/tolerance, functional reach, ~30 ft mobility   Shoulder Instructions      Home Living Family/patient expects to be discharged to:: Private residence Living Arrangements: Alone   Type of Home: Mobile home Home Access: Ramped entrance     Home Layout: One  level               Home Equipment: Walker - 2 wheels          Prior Functioning/Environment Level of Independence: Independent with assistive device(s)        Comments: MOD I using RW for mobility and ADLs - difficulty with meal mgmt        OT Problem List: Decreased activity tolerance         OT Goals(Current goals can be found in the care plan section) Acute Rehab OT Goals Patient Stated Goal: to go home with assistance for meals OT Goal Formulation: With patient Time For Goal Achievement: 02/23/21 Potential to Achieve Goals: Good   AM-PAC OT "6 Clicks" Daily Activity     Outcome Measure Help from another person eating meals?: None Help from another person taking care of personal grooming?: None Help from another person  toileting, which includes using toliet, bedpan, or urinal?: None Help from another person bathing (including washing, rinsing, drying)?: None Help from another person to put on and taking off regular upper body clothing?: None Help from another person to put on and taking off regular lower body clothing?: None 6 Click Score: 24   End of Session Equipment Utilized During Treatment: Rolling walker  Activity Tolerance: Patient tolerated treatment well Patient left: in bed;with call bell/phone within reach;with bed alarm set  OT Visit Diagnosis: Other abnormalities of gait and mobility (R26.89)                Time: 0973-5329 OT Time Calculation (min): 15 min Charges:  OT General Charges $OT Visit: 1 Visit OT Evaluation $OT Eval Low Complexity: 1 Low  Kathie Dike, M.S. OTR/L  02/09/21, 10:25 AM  ascom [phone]

## 2021-02-09 NOTE — Evaluation (Addendum)
Physical Therapy Evaluation Patient Details Name: Samuel Walton MRN: [id number] DOB: 20-Jul-1965 Today's Date: 02/09/2021   History of Present Illness  55 y.o. male with medical history significant of diabetes, polysubstance abuse including cocaine, history of right AKA with revision and subsequent near total removal of the right lower extremity, depression, anxiety, hyperlipidemia and neuropathy who came in with complaint of fall   Clinical Impression  Pt alert, oriented x3, depressed w/ bouts of crying regarding home situation. Encouragement provided and pt willing to participate w/ therapy. PLOF is independent with ADLs and mobility following initial AKA.   Pt demonstrates excellent mobility with transfers, Mod-I w/ RW. Pt ambulated w/ Mod- I, RW in-room without LOB demonstrating good eccentric and concentric control of LLE. Pt would benefit from continued rehabilitative services to improve endurance, balance, and navigate/ambulate community distances. Skilled PT intervention is indicated to address deficits in function, mobility, and to return to PLOF as able.  Discharge recommendations are HHPT.        Follow Up Recommendations Home health PT (outpatient if HHPT unavailable)    Equipment Recommendations  Wheelchair (measurements PT)    Recommendations for Other Services       Precautions / Restrictions Precautions Precautions: Fall Restrictions Weight Bearing Restrictions: No      Mobility  Bed Mobility Overal bed mobility: Modified Independent             General bed mobility comments: Utilizes bed rails    Transfers Overall transfer level: Modified independent Equipment used: Rolling walker (2 wheeled)             General transfer comment: sit <> stand w/o LOB or instability w/ RW  Ambulation/Gait Ambulation/Gait assistance: Modified independent (Device/Increase time) Gait Distance (Feet): 30 Feet Assistive device: Rolling walker (2 wheeled)        General Gait Details: Hops w/ RW, good endurance w/o SOB or fatigue  Stairs            Wheelchair Mobility    Modified Rankin (Stroke Patients Only)       Balance Overall balance assessment: No apparent balance deficits (not formally assessed)                                           Pertinent Vitals/Pain Pain Assessment: No/denies pain    Home Living Family/patient expects to be discharged to:: Private residence Living Arrangements: Alone Available Help at Discharge:  (none) Type of Home: Mobile home Home Access: Ramped entrance     Home Layout: One level Home Equipment: Environmental consultant - 2 wheels      Prior Function Level of Independence: Independent with assistive device(s)         Comments: MOD I using RW for mobility and ADLs     Hand Dominance        Extremity/Trunk Assessment   Upper Extremity Assessment Upper Extremity Assessment: Overall WFL for tasks assessed (Able to perform x 5 tricep dips w/ RW)    Lower Extremity Assessment Lower Extremity Assessment: RLE deficits/detail;LLE deficits/detail RLE Deficits / Details: AKA LLE Deficits / Details: Able to hop w/ RW without LOB    Cervical / Trunk Assessment Cervical / Trunk Assessment: Normal  Communication   Communication: No difficulties  Cognition Arousal/Alertness: Awake/alert Behavior During Therapy: WFL for tasks assessed/performed (Depressed) Overall Cognitive Status: Within Functional Limits for tasks assessed  General Comments      Exercises Other Exercises Other Exercises: Standing multi-direction reachs w/ RW, mod- I, no LOB   Assessment/Plan    PT Assessment Patient needs continued PT services  PT Problem List Decreased strength;Decreased mobility;Decreased range of motion;Decreased activity tolerance;Decreased balance;Decreased coordination       PT Treatment Interventions Therapeutic  activities;Gait training;Stair training;Functional mobility training;Neuromuscular re-education;Balance training;Therapeutic exercise    PT Goals (Current goals can be found in the Care Plan section)  Acute Rehab PT Goals Patient Stated Goal: become more independent PT Goal Formulation: With patient Time For Goal Achievement: 02/23/21 Potential to Achieve Goals: Good    Frequency Min 2X/week   Barriers to discharge        Co-evaluation               AM-PAC PT "6 Clicks" Mobility  Outcome Measure Help needed turning from your back to your side while in a flat bed without using bedrails?: None Help needed moving from lying on your back to sitting on the side of a flat bed without using bedrails?: A Little Help needed moving to and from a bed to a chair (including a wheelchair)?: None Help needed standing up from a chair using your arms (e.g., wheelchair or bedside chair)?: None Help needed to walk in hospital room?: A Little Help needed climbing 3-5 steps with a railing? : A Lot 6 Click Score: 20    End of Session Equipment Utilized During Treatment: Gait belt Activity Tolerance: Patient tolerated treatment well Patient left: in chair;with call bell/phone within reach;with chair alarm set;with nursing/sitter in room Nurse Communication: Mobility status PT Visit Diagnosis: Other abnormalities of gait and mobility (R26.89)    Time: 5284-1324 PT Time Calculation (min) (ACUTE ONLY): 23 min   Charges:   PT Evaluation $PT Eval Low Complexity: 1 Low PT Treatments $Therapeutic Exercise: 8-22 mins       [employer], SPT

## 2021-02-09 NOTE — TOC Transition Note (Signed)
Transition of Care [facility]) - CM/SW Discharge Note   Patient Details  Name: Samuel Walton MRN: [id number] Date of Birth: [birth year]  Transition of Care [facility]) CM/SW Contact:  Margarito Liner, LCSW Phone Number: 02/09/2021, 6:00 PM   Clinical Narrative:   Patient has orders to discharge home today. Medications picked up for [facility]. They did not have Novalin so MD switched to Humalin. [facility] [employer] arranged to home. Patient set up locksmith for 4:30 and then 5:30 but was unable to get him home by then. CSW called locksmith company and asked them to wait for him until he got home around 6:09. Dispatch person said they would let person coming to his home know. CSW gave locksmith number to [place] driver and asked him not to leave patient there if locksmith wasn't there when he arrived. Provided charity glucometer and walker. Patient's sister has been updated [phone]). Emailed her resources for substance use treatment. No further concerns. CSW signing off.  Final next level of care: Home w Home Health Services Barriers to Discharge: No Barriers Identified   Patient Goals and CMS Choice     Choice offered to / list presented to : NA  Discharge Placement                Patient to be transferred to facility by: [facility] Name of family member notified: Enid Derry Patient and family notified of of transfer: 02/09/21  Discharge Plan and Services     Post Acute Care Choice: Resumption of Svcs/PTA Provider          DME Arranged: Dan Humphreys DME Agency: Other - Comment [facility])       HH Arranged: RN, PT HH Agency: [facility] Date [facility] Agency Contacted: 02/09/21      Social Determinants of Health (SDOH) Interventions     Readmission Risk Interventions No flowsheet data found.

## 2021-02-09 NOTE — Consult Note (Signed)
Pharmacy Antibiotic Note  Samuel Walton is a 55 y.o. 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  Pharmacy has been consulted for vancomycin and cefepime dosing.  Pt has been afebrile since admin with WBC trend: 12.5>6.2>4.2>5.6.   Cultures were not obtained in ED, but were collected on 8/30 and have remained negative   Plan: -Vancomycin   -Will continue 1000mg  IV q12h     -Expected AUC: 532.3     -Css min: 14.1     -Wt used for CrCl and Ke: TBW     -Scr used: 0.8     -Vd: 0.72   -Will obtain labs around 4th or 5th dose if continued   -Cefepime   -Will continue 2g IV q8h based on renal function   -Will continue to monitor renal function and adjust dose as clinically indicated    Height: 5\' 7"  (170.2 cm) Weight: 63.5 kg (140 lb) IBW/kg (Calculated) : 66.1  Temp (24hrs), Avg:97.8 F (36.6 C), Min:97.7 F (36.5 C), Max:97.8 F (36.6 C)  Recent Labs  Lab 02/05/21 1502 02/06/21 0405 02/07/21 0929 02/08/21 0443 02/09/21 0705  WBC 12.5* 6.2 4.2 5.6 4.2  CREATININE 0.76 0.75 0.66 0.53* 0.59*     Estimated Creatinine Clearance: 93.7 mL/min (A) (by C-G formula based on SCr of 0.59 mg/dL (L)).    No Known Allergies  Antimicrobials this admission: Zosyn 8/29 >> 8/29 x1 Vanc 08/29 (evening)>>  Cefepime 08/30 >>  Cultures Bcx: no growth x2 days Wound culture: no growth x2 days  Thank you for allowing pharmacy to be a part of this patient's care.  Helane Briceno A, PharmD 02/09/2021 12:21 PM

## 2021-02-09 NOTE — Discharge Summary (Signed)
[facility] Discharge Summary   Patient: Samuel Walton FUX:[id number]  PCP: Pcp, No  Date of admission: 02/05/2021   Date of discharge: 02/09/2021 02/11/2021     Discharge Diagnoses:  Principal Problem:   Infected wound Active Problems:   Cocaine use disorder (HCC)   Necrotizing soft tissue infection   Tobacco use disorder   Type 1 diabetes (HCC)   Depressive disorder   Pressure injury of skin   Admitted From: Home Disposition:  Home HH  Recommendations for Outpatient Follow-up:  PCP: in 1 wk F/u wound care and wound VAC changes twice in a week as patient was doing prior to admission Follow up LABS/TEST:     Follow-up Information     Leona Singleton, MD Follow up.   Specialty: Family Medicine Why: Go to appointment on Thursday September 8, at 2:05PM Contact information: [address] Laurell Josephs [address] [place] [address] [phone]                Diet recommendation: Carb modified diet  Activity: The patient is advised to gradually reintroduce usual activities, as tolerated  Discharge Condition: stable  Code Status: Full code   History of present illness: As per the H and P dictated on admission Hospital Course:  55 y.o. 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  Patient has 1 day he says of his stump on the right side.  He apparently has a wound VAC but has been staying away from his home over in [place] with a girlfriend.  Apparently his Leviste duration was not ideal and hemoglobin able to use his wound VAC.  Has not been able to take care of himself.  There is report of a lot of drugs involved as patient has also past history of cocaine abuse and the environment was not conducive for his care.  He has therefore not  getting wound care and other medications.  Came to the ER was copious discharge from the stump and cellulitis surrounding the area.  While in the ER patient also voiced suicidal ideation but no plans.  He appears to be frustrated about his living condition.  Psych consult was called for and patient now denies suicidal ideation.  He has also voiced desire to have detoxification from drugs including cocaine.  He apparently had necrotizing fasciitis in the leg which led to initial surgery.  He is therefore being admitted for IV antibiotics as well as other supportive care..   Stump was evaluated.  Patient not septic or toxic.  Imaging negative for soft tissue gas.  WOCN consult.  Wound VAC to be replaced 8/30       Assessment and Plan: Principal Problem:   Infected wound Active Problems:   Cocaine use disorder (HCC)   Necrotizing soft tissue infection   Tobacco use disorder   Type 1 diabetes (HCC)   Depressive disorder   # Infected stump status post right AKA, Patient with infected stump, Secondary to nonadherence with medications and treatment. Wound VAC was removed at some point. Purulent drainage noted from stump site. Imaging negative for bone involvement or soft tissue gas. W OC consult Replace wound VAC done on 9/1, s/p broad-spectrum antibiotics and as needed pain control blood Cx NGTD and wound cultures still pending. AKA was done at [facility].  Considering  hemodynamic stability and no bone involvement or soft tissue gas seen on imaging, so defered surgical consultation.  Patient was advised to follow with general surgery and vascular surgery as an outpatient.  Continue wound VAC.  Patient was discharged on oral antibiotics doxycycline for 5 additional days. # Back pain s/p fall, Continue as needed medication for pain control. CT cervical spine did not show any acute finding. Complaining of pain on the left side of the neck, no pain in the thoracic or lumbar region. No need of further imaging #  Insulin-dependent diabetes mellitus, patient ran out of Humulin, patient was given Humulin 70/30 15 units twice daily, patient was advised to monitor blood glucose at home and continue diabetic diet and follow with PCP for further management as an outpatient. # Polysubstance abuse and Tobacco abuse. UDS positive for cocaine and cannabis Patient interested in quitting. TOC consult for substance abuse resources # Depression with possible suicidal ideation. On my interview patient denied suicidal intent or ideation.   Body mass index is 21.93 kg/m.  Interventions:    Pressure Injury 02/06/21 Foot Anterior;Left Stage 2 -  Partial thickness loss of dermis presenting as a shallow open injury with a red, pink wound bed without slough. from tight shoe rubbing (Active)  02/06/21 0200  Location: Foot  Location Orientation: Anterior;Left  Staging: Stage 2 -  Partial thickness loss of dermis presenting as a shallow open injury with a red, pink wound bed without slough.  Wound Description (Comments): from tight shoe rubbing  Present on Admission: Yes     Pain control  - [place] Controlled Substance Reporting System database was reviewed. - 5 day supply was provided. - Patient was instructed, not to drive, operate heavy machinery, perform activities at heights, swimming or participation in water activities or provide baby sitting services while on Pain, Sleep and Anxiety Medications; until his outpatient Physician has advised to do so again.  - Also recommended to not to take more than prescribed Pain, Sleep and Anxiety Medications.  Patient was seen by physical therapy, who recommended Home health, which was arranged. On the day of the discharge the patient's vitals were stable, and no other acute medical condition were reported by patient. the patient was felt safe to be discharge at Home with Home health.  Consultants: wound care Procedures: wound vac   Discharge Exam: General: Appear in  no distress, no Rash; Oral Mucosa Clear, moist. Cardiovascular: S1 and S2 Present, no Murmur, Respiratory: normal respiratory effort, Bilateral Air entry present and no Crackles, no wheezes Abdomen: Bowel Sound present, Soft and no tenderness, no hernia Extremities: no Pedal edema, no calf tenderness, s/p Righ AKA, wound VAC attached Neurology: alert and oriented to time, place, and person affect appropriate.  Filed Weights   02/05/21 1457  Weight: 63.5 kg   Vitals:   02/09/21 0509 02/09/21 1534  BP: 129/81 133/77  Pulse: 64 74  Resp: 18 18  Temp: 97.8 F (36.6 C) 98.1 F (36.7 C)  SpO2: 99% 99%    DISCHARGE MEDICATION: Allergies as of 02/09/2021   No Known Allergies      Medication List     STOP taking these medications    insulin NPH Human 100 UNIT/ML injection Commonly known as: NOVOLIN N       TAKE these medications    atorvastatin 20 MG tablet Commonly known as: LIPITOR Take 1 tablet (20 mg total) by mouth once daily.   doxycycline 100 MG capsule Commonly known as: VIBRAMYCIN  Take 1 capsule (100 mg total) by mouth 2 (two) times daily for 5 days.   gabapentin 400 MG capsule Commonly known as: NEURONTIN Take 1 capsule (400 mg total) by mouth 3 (three) times daily.   HumuLIN 70/30 (70-30) 100 UNIT/ML injection Generic drug: insulin NPH-regular Human Inject 15 Units into the skin 2 (two) times daily with a meal.   lisinopril 5 MG tablet Commonly known as: ZESTRIL Take (1/2) tablet (2.5 mg total) by mouth once daily.   oxyCODONE 5 MG immediate release tablet Commonly known as: Oxy IR/ROXICODONE Take 1 tablet (5 mg total) by mouth every 6 (six) hours as needed for up to 5 days for severe pain.               Discharge Care Instructions  (From admission, onward)           Start     Ordered   02/09/21 0000  Discharge wound care:       Comments: Continue wound care as above   02/09/21 1444           No Known Allergies Discharge  Instructions     Call MD for:  difficulty breathing, headache or visual disturbances   Complete by: As directed    Call MD for:  extreme fatigue   Complete by: As directed    Call MD for:  persistant dizziness or light-headedness   Complete by: As directed    Call MD for:  persistant nausea and vomiting   Complete by: As directed    Call MD for:  redness, tenderness, or signs of infection (pain, swelling, redness, odor or green/yellow discharge around incision site)   Complete by: As directed    Call MD for:  severe uncontrolled pain   Complete by: As directed    Call MD for:  temperature >100.4   Complete by: As directed    Diet - low sodium heart healthy   Complete by: As directed    Discharge instructions   Complete by: As directed    Follow with PCP in 1 week Continue wound care and dressing change, wound VAC twice a week Tuesday and Friday   Discharge wound care:   Complete by: As directed    Continue wound care as above   Increase activity slowly   Complete by: As directed        The results of significant diagnostics from this hospitalization (including imaging, microbiology, ancillary and laboratory) are listed below for reference.    Significant Diagnostic Studies: CT HEAD WO CONTRAST ( )  Result Date: 02/05/2021 CLINICAL DATA:  Status post trauma. EXAM: CT HEAD WITHOUT CONTRAST TECHNIQUE: Contiguous axial images were obtained from the base of the skull through the vertex without intravenous contrast. COMPARISON:  None. FINDINGS: Brain: No evidence of acute infarction, hemorrhage, hydrocephalus, extra-axial collection or mass lesion/mass effect. Vascular: No hyperdense vessel or unexpected calcification. Skull: Normal. Negative for fracture or focal lesion. Sinuses/Orbits: No acute finding. Other: Very mild left parieto-occipital scalp soft tissue swelling is seen. IMPRESSION: No acute intracranial pathology. Electronically Signed   By: Aram Candela M.D.   On:  02/05/2021 21:30   CT Cervical Spine Wo Contrast  Result Date: 02/05/2021 CLINICAL DATA:  Status post fall. EXAM: CT CERVICAL SPINE WITHOUT CONTRAST TECHNIQUE: Multidetector CT imaging of the cervical spine was performed without intravenous contrast. Multiplanar CT image reconstructions were also generated. COMPARISON:  None. FINDINGS: Alignment: Normal. Skull base and vertebrae: No acute fracture. No primary bone  lesion or focal pathologic process. Soft tissues and spinal canal: No prevertebral fluid or swelling. No visible canal hematoma. Disc levels: Mild anterior osteophyte formation is seen at the levels of C4-C5 and C5-C6. Normal multilevel intervertebral disc spaces are present. Normal bilateral multilevel facet joints are noted. Upper chest: Negative. Other: None. IMPRESSION: 1. No acute fracture or subluxation of the cervical spine. 2. Mild degenerative changes at the levels of C4-C5 and C5-C6. Electronically Signed   By: Aram Candela M.D.   On: 02/05/2021 21:33   DG Femur Min 2 Views Right  Result Date: 02/05/2021 CLINICAL DATA:  Right leg/thigh amputation, cellulitis, evaluate for soft tissue gas EXAM: RIGHT FEMUR 2 VIEWS COMPARISON:  None. FINDINGS: Status post right leg amputation. Mild soft tissue swelling is possible in this patient with reported history of cellulitis. However, there is no radiographic findings to suggest soft tissue gas in this location. Mild degenerative changes of the lower lumbar spine. IMPRESSION: Status post right leg amputation. No radiographic findings to suggest soft tissue gas. Electronically Signed   By: Charline Bills M.D.   On: 02/05/2021 22:40    Microbiology: Recent Results (from the past 240 hour(s))  Resp Panel by RT-PCR (Flu A&B, Covid) Nasopharyngeal Swab     Status: None   Collection Time: 02/05/21  8:29 PM   Specimen: Nasopharyngeal Swab; Nasopharyngeal(NP) swabs in vial transport medium  Result Value Ref Range Status   SARS Coronavirus 2  by RT PCR NEGATIVE NEGATIVE Final    Comment: (NOTE) SARS-CoV-2 target nucleic acids are NOT DETECTED.  The SARS-CoV-2 RNA is generally detectable in upper respiratory specimens during the acute phase of infection. The lowest concentration of SARS-CoV-2 viral copies this assay can detect is 138 copies/mL. A negative result does not preclude SARS-Cov-2 infection and should not be used as the sole basis for treatment or other patient management decisions. A negative result may occur with  improper specimen collection/handling, submission of specimen other than nasopharyngeal swab, presence of viral mutation(s) within the areas targeted by this assay, and inadequate number of viral copies(<138 copies/mL). A negative result must be combined with clinical observations, patient history, and epidemiological information. The expected result is Negative.  Fact Sheet for Patients:  BloggerCourse.com  Fact Sheet for Healthcare Providers:  SeriousBroker.it  This test is no t yet approved or cleared by the [place] FDA and  has been authorized for detection and/or diagnosis of SARS-CoV-2 by FDA under an Emergency Use Authorization (EUA). This EUA will remain  in effect (meaning this test can be used) for the duration of the COVID-19 declaration under Section 564(b)(1) of the Act, 21 U.S.C.section 360bbb-3(b)(1), unless the authorization is terminated  or revoked sooner.       Influenza A by PCR NEGATIVE NEGATIVE Final   Influenza B by PCR NEGATIVE NEGATIVE Final    Comment: (NOTE) The Xpert Xpress SARS-CoV-2/FLU/RSV plus assay is intended as an aid in the diagnosis of influenza from Nasopharyngeal swab specimens and should not be used as a sole basis for treatment. Nasal washings and aspirates are unacceptable for Xpert Xpress SARS-CoV-2/FLU/RSV testing.  Fact Sheet for Patients: BloggerCourse.com  Fact  Sheet for Healthcare Providers: SeriousBroker.it  This test is not yet approved or cleared by the [place] FDA and has been authorized for detection and/or diagnosis of SARS-CoV-2 by FDA under an Emergency Use Authorization (EUA). This EUA will remain in effect (meaning this test can be used) for the duration of the COVID-19 declaration under Section 564(b)(1)  of the Act, 21 U.S.C. section 360bbb-3(b)(1), unless the authorization is terminated or revoked.  Performed at [facility], [address] [place], [place] [address]   CULTURE, BLOOD (ROUTINE X 2) w Reflex to ID Panel     Status: None (Preliminary result)   Collection Time: 02/06/21 12:28 PM   Specimen: BLOOD RIGHT HAND  Result Value Ref Range Status   Specimen Description BLOOD RIGHT HAND  Final   Special Requests   Final    BOTTLES DRAWN AEROBIC AND ANAEROBIC Blood Culture adequate volume   Culture   Final    NO GROWTH 4 DAYS Performed at [facility], [address] [place], [place] [address]    Report Status PENDING  Incomplete  CULTURE, BLOOD (ROUTINE X 2) w Reflex to ID Panel     Status: None (Preliminary result)   Collection Time: 02/06/21 12:29 PM   Specimen: BLOOD  Result Value Ref Range Status   Specimen Description [facility]LOOD RIGHT [facility]  Final   Special Requests   Final    BOTTLES DRAWN AEROBIC AND ANAEROBIC Blood Culture adequate volume   Culture   Final    NO GROWTH 4 DAYS Performed at [facility], [address] [place], [place] [address]    Report Status PENDING  Incomplete     Labs: CBC: Recent Labs  Lab 02/05/21 1502 02/06/21 0405 02/07/21 0929 02/08/21 0443 02/09/21 0705  WBC 12.5* 6.2 4.2 5.6 4.2  HGB 13.6 13.7 12.5* 13.2 13.3  HCT 39.3 41.1 36.3* 38.0* 38.7*  MCV 87.7 87.4 89.0 87.8 87.2  PLT 241 241 203 209 220   Basic Metabolic Panel: Recent Labs  Lab 02/05/21 1502 02/06/21 0405 02/07/21 0929 02/08/21 0443 02/09/21 0705   NA 134* 139 135 139 135  K 3.7 3.4* 3.9 4.0 4.3  CL 101 107 105 109 105  CO2 21* 24 24 25 25   GLUCOSE 104* 105* 370* 148* 282*  BUN 20 16 13 18  23*  CREATININE 0.76 0.75 0.66 0.53* 0.59*  CALCIUM 9.2 8.7* 8.4* 8.9 8.8*  MG  --   --  2.0 2.2 2.3  PHOS  --   --  2.5 3.5 2.6   Liver Function Tests: Recent Labs  Lab 02/06/21 0405  AST 21  ALT 20  ALKPHOS 64  BILITOT 0.9  PROT 7.2  ALBUMIN 3.5   No results for input(s): LIPASE, AMYLASE in the last 168 hours. No results for input(s): AMMONIA in the last 168 hours. Cardiac Enzymes: No results for input(s): CKTOTAL, CKMB, CKMBINDEX, TROPONINI in the last 168 hours. BNP (last 3 results) No results for input(s): BNP in the last 8760 hours. CBG: Recent Labs  Lab 02/08/21 1636 02/08/21 2012 02/09/21 0758 02/09/21 1155 02/09/21 1358  GLUCAP 261* 203* 258* 240* 258*    Time spent: 35 minutes  Signed:  Gillis Santaileep Curtistine Pettitt  [facility] 02/09/2021  2:52 PM

## 2021-02-09 NOTE — Progress Notes (Signed)
Pt discharged to home. Left unit in wheelchair pushed by nurse tech. Left in stable condition.  

## 2021-02-09 NOTE — Progress Notes (Addendum)
Pt given discharge instructions. No concerns voiced. Pt's home meds retrieved from pharmacy and hand-delivered to pt by this Clinical research associate. Wound vac switched to portable vac (pt's own from home). Decreased to 125 mm hg per order. Changed from 175 mm hg as noted on vac (? per previous setting). Prescription x 1 given for oxycodone.

## 2021-02-13 LAB — CULTURE, BLOOD (ROUTINE X 2)
Culture: NO GROWTH
Culture: NO GROWTH
Special Requests: ADEQUATE
Special Requests: ADEQUATE

## 2021-03-22 ENCOUNTER — Emergency Department
Admission: EM | Admit: 2021-03-22 | Discharge: 2021-03-22 | Disposition: A | Discharge status: Other Acute Inpatient Hospital | Payer: No Typology Code available for payment source | Attending: Emergency Medicine | Admitting: Emergency Medicine

## 2021-03-22 ENCOUNTER — Emergency Department: Payer: No Typology Code available for payment source

## 2021-03-22 ENCOUNTER — Other Ambulatory Visit: Payer: Self-pay

## 2021-03-22 ENCOUNTER — Encounter: Payer: Self-pay | Admitting: Emergency Medicine

## 2021-03-22 DIAGNOSIS — A419 Sepsis, unspecified organism: Secondary | ICD-10-CM | POA: Diagnosis not present

## 2021-03-22 DIAGNOSIS — Z794 Long term (current) use of insulin: Secondary | ICD-10-CM | POA: Insufficient documentation

## 2021-03-22 DIAGNOSIS — Z20822 Contact with and (suspected) exposure to covid-19: Secondary | ICD-10-CM | POA: Diagnosis not present

## 2021-03-22 DIAGNOSIS — E109 Type 1 diabetes mellitus without complications: Secondary | ICD-10-CM | POA: Diagnosis not present

## 2021-03-22 DIAGNOSIS — R2231 Localized swelling, mass and lump, right upper limb: Secondary | ICD-10-CM | POA: Diagnosis present

## 2021-03-22 DIAGNOSIS — L03113 Cellulitis of right upper limb: Secondary | ICD-10-CM | POA: Diagnosis not present

## 2021-03-22 DIAGNOSIS — F17210 Nicotine dependence, cigarettes, uncomplicated: Secondary | ICD-10-CM | POA: Diagnosis not present

## 2021-03-22 LAB — COMPREHENSIVE METABOLIC PANEL
ALT: 19 U/L (ref 0–44)
AST: 25 U/L (ref 15–41)
Albumin: 3.7 g/dL (ref 3.5–5.0)
Alkaline Phosphatase: 72 U/L (ref 38–126)
Anion gap: 14 (ref 5–15)
BUN: 18 mg/dL (ref 6–20)
CO2: 23 mmol/L (ref 22–32)
Calcium: 9.1 mg/dL (ref 8.9–10.3)
Chloride: 92 mmol/L — ABNORMAL LOW (ref 98–111)
Creatinine, Ser: 0.63 mg/dL (ref 0.61–1.24)
GFR, Estimated: >60 mL/min (ref >60)
Glucose, Bld: 300 mg/dL — ABNORMAL HIGH (ref 70–99)
Potassium: 3.3 mmol/L — ABNORMAL LOW (ref 3.5–5.1)
Sodium: 129 mmol/L — ABNORMAL LOW (ref 135–145)
Total Bilirubin: 1.7 mg/dL — ABNORMAL HIGH (ref 0.3–1.2)
Total Protein: 7.6 g/dL (ref 6.5–8.1)

## 2021-03-22 LAB — URINALYSIS, COMPLETE (UACMP) WITH MICROSCOPIC
Bacteria, UA: NONE SEEN
Bilirubin Urine: NEGATIVE
Glucose, UA: >=500 mg/dL — AB
Ketones, ur: 20 mg/dL — AB
Leukocytes,Ua: NEGATIVE
Nitrite: NEGATIVE
Protein, ur: 30 mg/dL — AB
Specific Gravity, Urine: 1.045 — ABNORMAL HIGH (ref 1.005–1.030)
Squamous Epithelial / HPF: NONE SEEN (ref 0–5)
pH: 5 (ref 5.0–8.0)

## 2021-03-22 LAB — CBC WITH DIFFERENTIAL/PLATELET
Abs Immature Granulocytes: 1.01 10*3/uL — ABNORMAL HIGH (ref 0.00–0.07)
Basophils Absolute: 0.1 10*3/uL (ref 0.0–0.1)
Basophils Relative: 0 %
Eosinophils Absolute: 0.1 10*3/uL (ref 0.0–0.5)
Eosinophils Relative: 0 %
HCT: 35.7 % — ABNORMAL LOW (ref 39.0–52.0)
Hemoglobin: 12.4 g/dL — ABNORMAL LOW (ref 13.0–17.0)
Immature Granulocytes: 4 %
Lymphocytes Relative: 2 %
Lymphs Abs: 0.5 10*3/uL — ABNORMAL LOW (ref 0.7–4.0)
MCH: 30.2 pg (ref 26.0–34.0)
MCHC: 34.7 g/dL (ref 30.0–36.0)
MCV: 86.9 fL (ref 80.0–100.0)
Monocytes Absolute: 2 10*3/uL — ABNORMAL HIGH (ref 0.1–1.0)
Monocytes Relative: 9 %
Neutro Abs: 19.9 10*3/uL — ABNORMAL HIGH (ref 1.7–7.7)
Neutrophils Relative %: 85 %
Platelets: 209 10*3/uL (ref 150–400)
RBC: 4.11 MIL/uL — ABNORMAL LOW (ref 4.22–5.81)
RDW: 14.7 % (ref 11.5–15.5)
WBC: 23.5 10*3/uL — ABNORMAL HIGH (ref 4.0–10.5)
nRBC: 0 % (ref 0.0–0.2)

## 2021-03-22 LAB — LACTIC ACID, PLASMA
Lactic Acid, Venous: 1.8 mmol/L (ref 0.5–1.9)
Lactic Acid, Venous: 1.4 mmol/L (ref 0.5–1.9)

## 2021-03-22 LAB — RESP PANEL BY RT-PCR (FLU A&B, COVID) ARPGX2
Influenza A by PCR: NEGATIVE
Influenza B by PCR: NEGATIVE
SARS Coronavirus 2 by RT PCR: NEGATIVE

## 2021-03-22 LAB — SEDIMENTATION RATE: Sed Rate: 83 mm/hr — ABNORMAL HIGH (ref 0–20)

## 2021-03-22 MED ORDER — CLINDAMYCIN PHOSPHATE 600 MG/50ML IV SOLN
600.0000 mg | Freq: Once | INTRAVENOUS | Status: DC
  Filled 2021-03-22: qty 50

## 2021-03-22 MED ORDER — VANCOMYCIN HCL IN DEXTROSE 1-5 GM/200ML-% IV SOLN
1000.0000 mg | Freq: Once | INTRAVENOUS | Status: AC
  Administered 2021-03-22: 1000 mg via INTRAVENOUS
  Filled 2021-03-22: qty 200

## 2021-03-22 MED ORDER — PIPERACILLIN-TAZOBACTAM 3.375 G IVPB 30 MIN
3.3750 g | Freq: Once | INTRAVENOUS | Status: AC
  Administered 2021-03-22: 3.375 g via INTRAVENOUS
  Filled 2021-03-22: qty 50

## 2021-03-22 MED ORDER — HYDROMORPHONE HCL 1 MG/ML IJ SOLN
0.5000 mg | Freq: Once | INTRAMUSCULAR | Status: AC
  Administered 2021-03-22: 0.5 mg via INTRAVENOUS
  Filled 2021-03-22: qty 1

## 2021-03-22 MED ORDER — CLINDAMYCIN PHOSPHATE 900 MG/50ML IV SOLN
900.0000 mg | Freq: Once | INTRAVENOUS | Status: AC
  Administered 2021-03-22: 900 mg via INTRAVENOUS
  Filled 2021-03-22: qty 50

## 2021-03-22 MED ORDER — HYDROMORPHONE HCL 1 MG/ML IJ SOLN
0.5000 mg | Freq: Once | INTRAMUSCULAR | Status: AC | PRN
  Administered 2021-03-22: 0.5 mg via INTRAVENOUS
  Filled 2021-03-22: qty 1

## 2021-03-22 MED ORDER — SODIUM CHLORIDE 0.9 % IV BOLUS (SEPSIS)
1000.0000 mL | Freq: Once | INTRAVENOUS | Status: AC
  Administered 2021-03-22: 1000 mL via INTRAVENOUS

## 2021-03-22 MED ORDER — IOHEXOL 350 MG/ML SOLN
80.0000 mL | Freq: Once | INTRAVENOUS | Status: AC | PRN
  Administered 2021-03-22: 80 mL via INTRAVENOUS
  Filled 2021-03-22: qty 80

## 2021-03-22 NOTE — ED Notes (Signed)
CBG was 276 at this time.

## 2021-03-22 NOTE — ED Notes (Signed)
Pt given urinal and reminded that sample needed. States will try soon. Stretcher locked low. Rails up. Call bell within reach.

## 2021-03-22 NOTE — ED Notes (Signed)
NT to take BG soon.

## 2021-03-22 NOTE — ED Notes (Signed)
[facility]  CALLED  PER  DR  FUNKE  MD 

## 2021-03-22 NOTE — ED Provider Notes (Signed)
[facility] Emergency Department Provider Note  ____________________________________________   Event Date/Time   First MD Initiated Contact with Patient 03/22/21 1030     (approximate)  I have reviewed the triage vital signs and the nursing notes.   HISTORY  Chief Complaint Insect Bite    HPI Samuel Walton is a 54 y.o. male with type 1 diabetes who comes in with concerns for right hand pain.  Patient states that 2 to 3 days ago he noticed some bite marks on his hand and last night he developed swelling in the hand, constant, nothing makes it better or worse.  Reports that the pain is severe, constant.  Has not been on any recent antibiotics.  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  Denies any fevers Patient reports having a tetanus shot in the last 5 years   History reviewed. No pertinent past medical history.  Patient Active Problem List   Diagnosis Date Noted   Pressure injury of skin 02/08/2021   Infected wound 02/05/2021   Type 1 diabetes ([place]) 12/09/2020   Tobacco use disorder 11/08/2020   Cocaine use disorder ([place]) 10/21/2020   Depressive disorder 10/21/2020   Necrotizing soft tissue infection 10/08/2020    History reviewed. No pertinent surgical history.  Prior to Admission medications   Medication Sig Start Date End Date Taking? Authorizing Provider  atorvastatin (LIPITOR) 20 MG tablet Take 1 tablet (20 mg total) by mouth once daily. 02/09/21 03/11/21  Val Riles, MD  gabapentin (NEURONTIN) 400 MG capsule Take 1 capsule (400 mg total) by mouth 3 (three) times daily. 02/09/21 03/11/21  Val Riles, MD  insulin NPH-regular Human (HUMULIN 70/30) (70-30) 100 UNIT/ML injection Inject 15 Units into the skin 2 (two) times daily with a meal. 02/09/21   Val Riles, MD  lisinopril (ZESTRIL) 5 MG tablet Take (1/2) tablet (2.5 mg total) by mouth once daily. 02/09/21 03/11/21  Val Riles, MD     Allergies Patient has no known allergies.  No family history on file.  Social History Social History   Tobacco Use   Smoking status: Every Day    Types: Cigarettes   Smokeless tobacco: Current      Review of Systems Constitutional: No fever/chills Eyes: No visual changes. ENT: No sore throat. Cardiovascular: Denies chest pain. Respiratory: Denies shortness of breath. Gastrointestinal: No abdominal pain.  No nausea, no vomiting.  No diarrhea.  No constipation. Genitourinary: Negative for dysuria. Musculoskeletal: Right hand pain Skin: Negative for rash. Neurological: Negative for headaches, focal weakness or numbness. All other ROS negative ____________________________________________   PHYSICAL EXAM:  VITAL SIGNS: ED Triage Vitals  Enc Vitals Group     BP 03/22/21 0911 (!) 155/90     Pulse Rate 03/22/21 0911 100     Resp 03/22/21 0911 18     Temp 03/22/21 0911 98.4 F (36.9 C)     Temp Source 03/22/21 0911 Oral     SpO2 03/22/21 0911 98 %     Weight 03/22/21 0912 139 lb 15.9 oz (63.5 kg)     Height 03/22/21 0912 _0  (1.702 m)     Head Circumference --      Peak Flow --      Pain Score 03/22/21 0912 0     Pain Loc --      Pain Edu? --      Excl. in [place]? --     Constitutional: Alert  and oriented. Well appearing and in no acute distress. Eyes: Conjunctivae are normal. EOMI. Head: Atraumatic. Nose: No congestion/rhinnorhea. Mouth/Throat: Mucous membranes are moist.   Neck: No stridor. Trachea Midline. FROM Cardiovascular: Normal rate, regular rhythm. Grossly normal heart sounds.  Good peripheral circulation. Respiratory: Normal respiratory effort.  No retractions. Lungs CTAB. Gastrointestinal: Soft and nontender. No distention. No abdominal bruits.  Musculoskeletal: Swelling noted to the back of the right hand mostly around the right thumb with some marks noted.  2+ distal pulse.  Range of motion decreased secondary to swelling.  Significant pain.   Redness streaking up the forearm and a single streak going up into the armpit. Neurologic:  Normal speech and language. No gross focal neurologic deficits are appreciated.  Skin:  Skin is warm, dry and intact. No rash noted. Psychiatric: Mood and affect are normal. Speech and behavior are normal. GU: Deferred   ____________________________________________   LABS (all labs ordered are listed, but only abnormal results are displayed)  Labs Reviewed  COMPREHENSIVE METABOLIC PANEL - Abnormal; Notable for the following components:      Result Value   Sodium 129 (*)    Potassium 3.3 (*)    Chloride 92 (*)    Glucose, Bld 300 (*)    Total Bilirubin 1.7 (*)    All other components within normal limits  CBC WITH DIFFERENTIAL/PLATELET - Abnormal; Notable for the following components:   WBC 23.5 (*)    RBC 4.11 (*)    Hemoglobin 12.4 (*)    HCT 35.7 (*)    Neutro Abs 19.9 (*)    Lymphs Abs 0.5 (*)    Monocytes Absolute 2.0 (*)    Abs Immature Granulocytes 1.01 (*)    All other components within normal limits  LACTIC ACID, PLASMA  LACTIC ACID, PLASMA  URINALYSIS, COMPLETE (UACMP) WITH MICROSCOPIC   ____________________________________________ RADIOLOGY  Official radiology report(s): CT HAND RIGHT W CONTRAST  Result Date: 03/22/2021 CLINICAL DATA:  Osteomyelitis, hand EXAM: CT OF THE UPPER RIGHT EXTREMITY WITH CONTRAST TECHNIQUE: Multidetector CT imaging of the upper right extremity was performed according to the standard protocol following intravenous contrast administration. CONTRAST:  71mL OMNIPAQUE IOHEXOL 350 MG/ML SOLN COMPARISON:  None. FINDINGS: Bones/Joint/Cartilage There is no evidence of acute fracture. There is no frank bony destruction. Normal alignment. 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 Ligaments Suboptimally assessed by CT. Muscles and Tendons No muscle atrophy.  No intramuscular fluid collection. Soft tissues Diffuse soft tissue swelling of the  hand. No well-defined/drainable fluid collection. IMPRESSION: Diffuse soft tissue swelling of the right hand. No well-defined/drainable fluid collection. No frank bony destruction to suggest osteomyelitis. If there is persistent clinical concern, MRI would be more sensitive. Electronically Signed   By: Maurine Simmering M.D.   On: 03/22/2021 11:35    ____________________________________________   PROCEDURES  Procedure(s) performed (including Critical Care):  .Critical Care Performed by: Vanessa [place], MD Authorized by: Vanessa [place], MD   Critical care provider statement:    Critical care time (minutes):  45   Critical care was necessary to treat or prevent imminent or life-threatening deterioration of the following conditions:  Respiratory failure and sepsis   Critical care was time spent personally by me on the following activities:  Blood draw for specimens, discussions with consultants, discussions with primary provider, obtaining history from patient or surrogate, review of old charts, re-evaluation of patient's condition, pulse oximetry, ordering and review of radiographic studies, ordering and review of laboratory studies and ordering  and performing treatments and interventions   I assumed direction of critical care for this patient from another provider in my specialty: yes     Care discussed with: accepting provider at another facility     ____________________________________________   [place] / ASSESSMENT AND PLAN / ED COURSE  Samuel Walton was evaluated in Emergency Department on 03/22/2021 for the symptoms described in the history of present illness. He was evaluated in the context of the global COVID-19 pandemic, which necessitated consideration that the patient might be at risk for infection with the SARS-CoV-2 virus that causes COVID-19. Institutional protocols and algorithms that pertain to the evaluation of patients at risk for COVID-19 are in a state of rapid change  based on information released by regulatory bodies including the CDC and federal and state organizations. These policies and algorithms were followed during the patient's care in the ED.    Patient comes in with significant swelling noted to the right hand.  White count came back from [facility] and noted to be in the 20s.  Patient has a history of neck fas requiring amputation I am concerned that this could be happening again.  Patient was seen back from the waiting room and upon me evaluating patient I immediately put on antibiotics for vanc, Zosyn, clindamycin.  I have alerted staff that patient needs the next bed.  I have placed a CT scan to further evaluate for this given patient may need transfer out given it is involving his hand.  This could also be cellulitis versus abscess.  Sepsis alert was called given he met criteria with elevated white count and pulse rate.  We will get blood cultures to further evaluate.  Patient given some IV Dilaudid to help with pain.  CT scan without evidence of abscess but given my high clinical suspicion I discussed with Dr. Harlow Mares from orthopedics who agrees with transfer to higher level of care in case he ends up needing to have a debridement due to history of necrotizing fasciitis.  Discussed the case with patient and he is requesting [facility] given he has been there previously.  We will call them for transfer.  Discussed the case with Dr. From the orthopedic team who recommends ED to ED transfer for them to evaluate.  Patient accepted by ED Dr. Vernell Leep  1:31 PM lactate is normal, repeat vitals are stable at this time      ____________________________________________   FINAL CLINICAL IMPRESSION(S) / ED DIAGNOSES   Final diagnoses:  Cellulitis of right upper extremity  Sepsis, due to unspecified organism, unspecified whether acute organ dysfunction present [facility])      MEDICATIONS GIVEN DURING THIS VISIT:  Medications  HYDROmorphone (DILAUDID) injection 0.5 mg  (has no administration in time range)  sodium chloride 0.9 % bolus 1,000 mL (1,000 mLs Intravenous New Bag/Given 03/22/21 1041)    And  sodium chloride 0.9 % bolus 1,000 mL (1,000 mLs Intravenous New Bag/Given 03/22/21 1146)  vancomycin (VANCOCIN) IVPB 1000 mg/200 mL premix (1,000 mg Intravenous New Bag/Given 03/22/21 1152)  piperacillin-tazobactam (ZOSYN) IVPB 3.375 g (0 g Intravenous Stopped 03/22/21 1218)  HYDROmorphone (DILAUDID) injection 0.5 mg (0.5 mg Intravenous Given 03/22/21 1042)  clindamycin (CLEOCIN) IVPB 900 mg (0 mg Intravenous Stopped 03/22/21 1252)  iohexol (OMNIPAQUE) 350 MG/ML injection 80 mL (80 mLs Intravenous Contrast Given 03/22/21 1107)     ED Discharge Orders     None        Note:  This document was prepared using Dragon voice  recognition software and may include unintentional dictation errors.    Vanessa Daggett, MD 03/22/21 1331

## 2021-03-22 NOTE — ED Notes (Signed)
XRAY  POWERSHARE  WITH  [facility] 

## 2021-03-22 NOTE — ED Notes (Signed)
Assessed R arm. Swelling and redness remain within original border marked by previous RN.

## 2021-03-22 NOTE — Consult Note (Signed)
CODE SEPSIS - PHARMACY COMMUNICATION  **Broad Spectrum Antibiotics should be administered within 1 hour of Sepsis diagnosis**  Time Code Sepsis Called/Page Received: 1029  Antibiotics Ordered: clindamycin, zosyn, and vanc  Time of 1st antibiotic administration: 1146   Doloris Hall, PharmD Pharmacy Resident  03/22/2021 10:30 AM

## 2021-03-22 NOTE — Progress Notes (Signed)
Elink following code sepsis °

## 2021-03-22 NOTE — ED Notes (Signed)
EMTALA reviewed by Charge RN, Kend[place].  Transfer e-signature obtained.  

## 2021-03-22 NOTE — Consult Note (Signed)
PHARMACY -  BRIEF ANTIBIOTIC NOTE   Pharmacy has received consult(s) for vancomycin from an ED provider.  The patient's profile has been reviewed for ht/wt/allergies/indication/available labs.    One time order(s) placed for Vancomycin 1g   Further antibiotics/pharmacy consults should be ordered by admitting physician if indicated.                       Thank you, Doloris Hall, PharmD Pharmacy Resident  03/22/2021 10:34 AM

## 2021-03-22 NOTE — ED Notes (Signed)
Placed in recliner chair for comfort. 

## 2021-03-22 NOTE — ED Notes (Signed)
Pt assisted with TV remote. Urinal emptied of about 250cc of urine.

## 2021-03-22 NOTE — ED Notes (Signed)
Patient transported to CT 

## 2021-03-22 NOTE — ED Notes (Signed)
[facility] ground transport given report. State no trucks currently available but will call us back with info once one is. No bed/nurse officially assigned yet. Secretary states will send this info to RN once available.

## 2021-03-22 NOTE — ED Triage Notes (Signed)
C/O right hand spider bite x 2-3 days.  Swelling worse last night.  Per EMS report, VS wnl.   CBG:  266.    Right hand +3 swelling and red streaks going up right arm to upper arm.  + radial pulse.

## 2021-03-22 NOTE — Progress Notes (Signed)
Notified provider of need to order blood cultures.  

## 2021-03-27 LAB — CULTURE, BLOOD (ROUTINE X 2)
Culture: NO GROWTH
Culture: NO GROWTH
Special Requests: ADEQUATE

## 2021-07-13 ENCOUNTER — Telehealth: Payer: Self-pay | Admitting: Pharmacist

## 2021-07-13 NOTE — Telephone Encounter (Signed)
Patient failed to provide requested 2023 financial documentation. No additional medication assistance will be provided by [facility] without the required proof of income documentation. Patient notified by letter. ? ?Samuel Walton ?Medication Management ?

## 2021-07-18 ENCOUNTER — Other Ambulatory Visit: Payer: Self-pay

## 2021-07-27 ENCOUNTER — Telehealth: Payer: Self-pay | Admitting: Pharmacy Technician

## 2021-07-27 NOTE — Telephone Encounter (Signed)
Patient failed to provide 2023 proof of income.  No additional medication assistance will be provided by [facility] without the required proof of income documentation.  Patient notified by letter.  [name] Care Manager [facility]    [name] [place], [place]  [address]  July 13, 2021    Dear Samuel Walton:  This is to inform you that you are no longer eligible to receive medication assistance at [facility].  The reason(s) are:    _____Your total gross monthly household income exceeds 300% of the Federal Poverty Level.   _____Tangible assets (savings, checking, stocks/bonds, pension, retirement, etc.) exceeds our limit  _____You are eligible to receive benefits from [facility], [facility] or HIV Medication              Assistance Program _____You are eligible to receive benefits from a Medicare Part D plan _____You have prescription insurance  _____You are not an [place] resident __X__Failure to provide all requested documentation (proof of income for 2023, and/or Patient Intake Application, DOH Attestation, Contract, etc).    Medication assistance will resume once all requested documentation has been returned to our clinic.  If you have questions, please contact our clinic at [phone].    Thank you,  [facility]

## 2022-07-18 IMAGING — CT CT HAND*R* W/CM
2 of 3 series · 6 of 18 positions shown, 7 images · IV contrast (omnipaque)
Comparison: None.

CLINICAL DATA: Osteomyelitis, hand

EXAM:
CT OF THE UPPER RIGHT EXTREMITY WITH CONTRAST
TECHNIQUE: Multidetector CT imaging of the upper right extremity was performed
according to the standard protocol following intravenous contrast
administration.
CONTRAST:  80mL OMNIPAQUE IOHEXOL 350 MG/ML SOLN

[Series 7: axial st · oblique · 0.49mm/px · 3 of 216 slices shown, 4 images]
[im 54/216  soft-tissue]
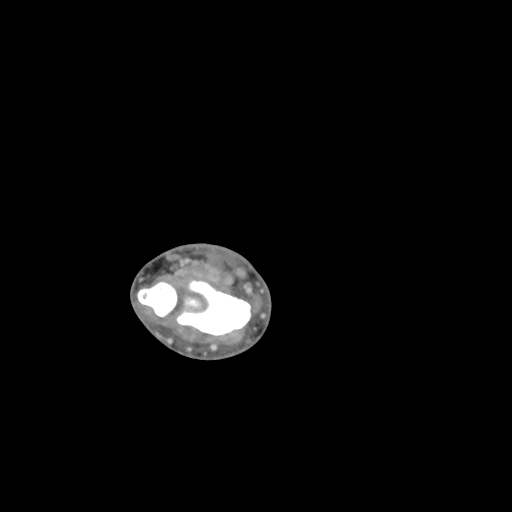
[im 54/216  bone]
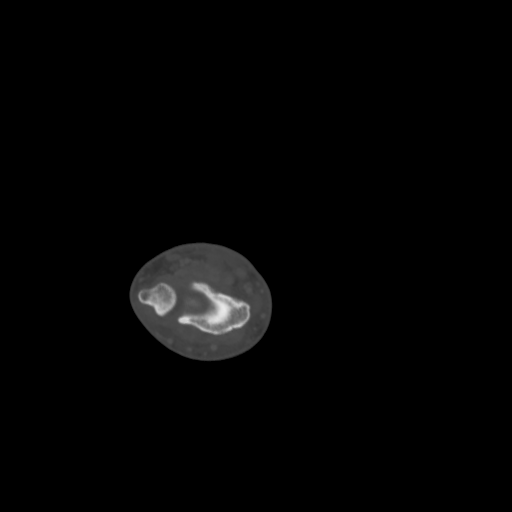
[im 108/216  bone]
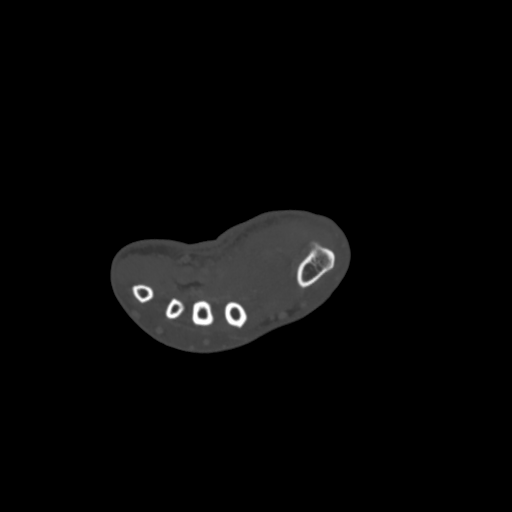
[im 162/216  bone]
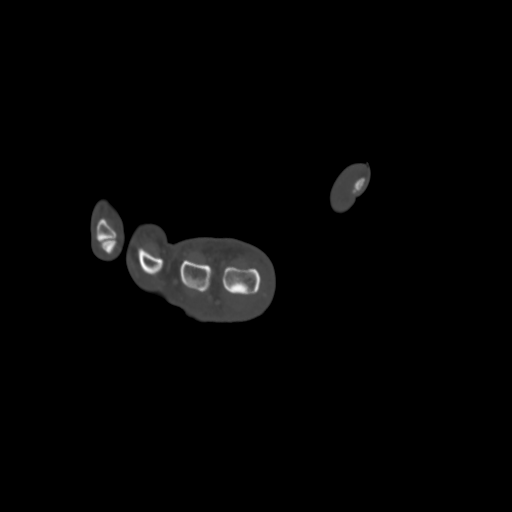

[Series 8: cor st · coronal · 0.28mm/px · 3 of 133 slices shown]
[im 45/133  bone]
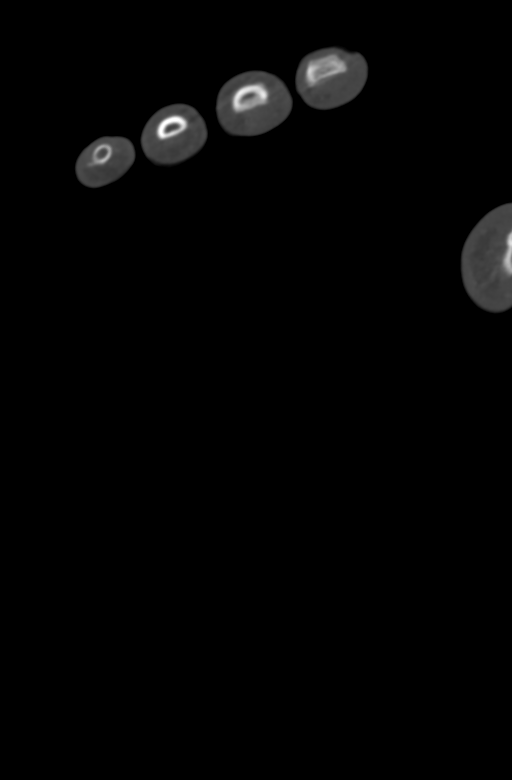
[im 53/133  bone]
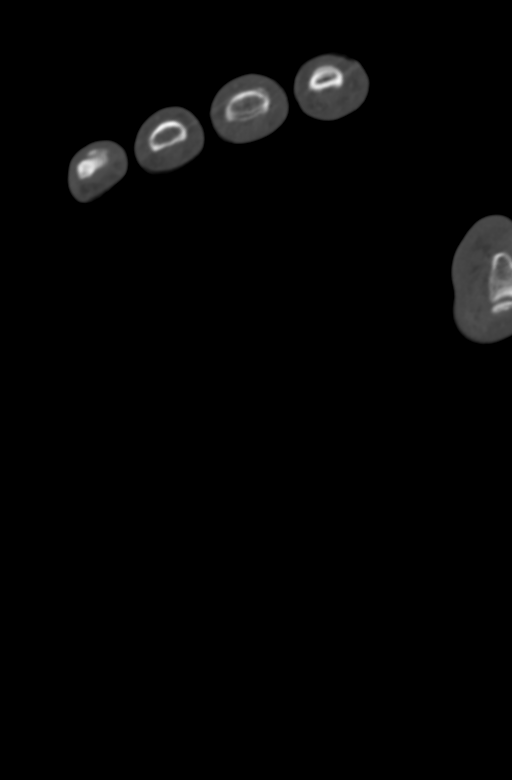
[im 80/133  bone]
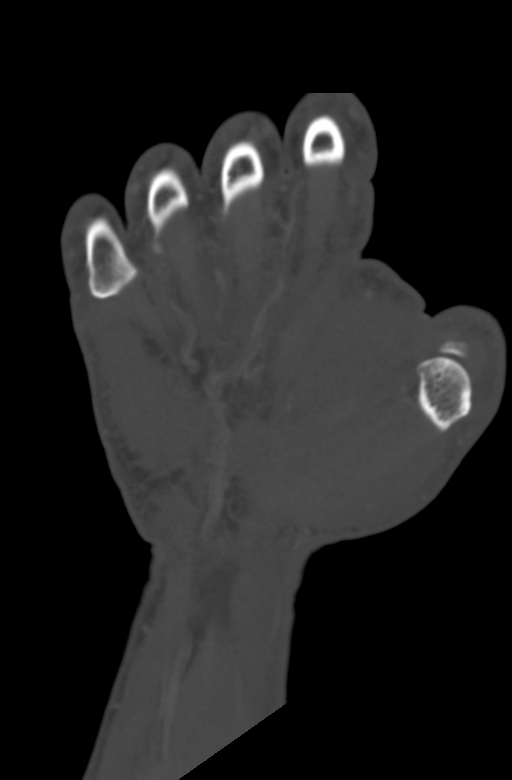

[6 of 18 positions shown; findings below may reference images not displayed]

FINDINGS: Bones/Joint/Cartilage

There is no evidence of acute fracture. There is no frank bony
destruction. Normal alignment. Mild radiocarpal and base of thumb
degenerative arthritis.

Ligaments

Suboptimally assessed by CT.

Muscles and Tendons

No muscle atrophy.  No intramuscular fluid collection.

Soft tissues

Diffuse soft tissue swelling of the hand. No well-defined/drainable
fluid collection.
IMPRESSION: Diffuse soft tissue swelling of the right hand. No
well-defined/drainable fluid collection. No frank bony destruction
to suggest osteomyelitis. If there is persistent clinical concern,
MRI would be more sensitive.
